# Patient Record
Sex: Male | Born: 1960 | Race: Black or African American | Hispanic: No | Smoking: Former smoker
Health system: Southern US, Community
[De-identification: ages and names within clinical notes are randomized; demographics above are authoritative.]

## PROBLEM LIST (undated history)

## (undated) DIAGNOSIS — I10 Essential (primary) hypertension: Secondary | ICD-10-CM

## (undated) DIAGNOSIS — R569 Unspecified convulsions: Secondary | ICD-10-CM

## (undated) DIAGNOSIS — C801 Malignant (primary) neoplasm, unspecified: Secondary | ICD-10-CM

## (undated) DIAGNOSIS — N189 Chronic kidney disease, unspecified: Secondary | ICD-10-CM

## (undated) DIAGNOSIS — F32A Depression, unspecified: Secondary | ICD-10-CM

## (undated) DIAGNOSIS — F329 Major depressive disorder, single episode, unspecified: Secondary | ICD-10-CM

## (undated) HISTORY — PX: HEMICOLECTOMY W/ OSTOMY: SHX1738

---

## 2005-03-30 ENCOUNTER — Emergency Department: Payer: Self-pay | Admitting: Emergency Medicine

## 2005-10-25 ENCOUNTER — Emergency Department: Payer: Self-pay | Admitting: General Practice

## 2006-06-22 ENCOUNTER — Emergency Department: Payer: Self-pay | Admitting: Emergency Medicine

## 2007-03-16 ENCOUNTER — Emergency Department: Payer: Self-pay | Admitting: Emergency Medicine

## 2007-03-16 ENCOUNTER — Other Ambulatory Visit: Payer: Self-pay

## 2007-03-31 ENCOUNTER — Emergency Department: Payer: Self-pay | Admitting: Emergency Medicine

## 2009-01-13 ENCOUNTER — Emergency Department: Payer: Self-pay | Admitting: Emergency Medicine

## 2009-01-18 ENCOUNTER — Emergency Department: Payer: Self-pay | Admitting: Emergency Medicine

## 2009-03-09 ENCOUNTER — Emergency Department: Payer: Self-pay | Admitting: Emergency Medicine

## 2009-08-31 ENCOUNTER — Emergency Department: Payer: Self-pay | Admitting: Emergency Medicine

## 2010-08-02 ENCOUNTER — Inpatient Hospital Stay: Payer: Self-pay | Admitting: Unknown Physician Specialty

## 2012-06-14 ENCOUNTER — Other Ambulatory Visit: Payer: Self-pay

## 2012-06-30 ENCOUNTER — Encounter (HOSPITAL_COMMUNITY): Payer: Self-pay | Admitting: General Practice

## 2012-06-30 ENCOUNTER — Inpatient Hospital Stay (HOSPITAL_COMMUNITY)
Admission: EM | Admit: 2012-06-30 | Discharge: 2012-07-05 | DRG: 908 | Disposition: A | Discharge status: Home / Self Care | Payer: Non-veteran care | Attending: Internal Medicine | Admitting: Internal Medicine

## 2012-06-30 DIAGNOSIS — E876 Hypokalemia: Secondary | ICD-10-CM | POA: Diagnosis not present

## 2012-06-30 DIAGNOSIS — I10 Essential (primary) hypertension: Secondary | ICD-10-CM

## 2012-06-30 DIAGNOSIS — IMO0002 Reserved for concepts with insufficient information to code with codable children: Secondary | ICD-10-CM

## 2012-06-30 DIAGNOSIS — R7989 Other specified abnormal findings of blood chemistry: Secondary | ICD-10-CM | POA: Diagnosis present

## 2012-06-30 DIAGNOSIS — R509 Fever, unspecified: Secondary | ICD-10-CM | POA: Diagnosis not present

## 2012-06-30 DIAGNOSIS — F10939 Alcohol use, unspecified with withdrawal, unspecified: Secondary | ICD-10-CM | POA: Diagnosis present

## 2012-06-30 DIAGNOSIS — E860 Dehydration: Secondary | ICD-10-CM | POA: Diagnosis present

## 2012-06-30 DIAGNOSIS — R410 Disorientation, unspecified: Secondary | ICD-10-CM | POA: Diagnosis not present

## 2012-06-30 DIAGNOSIS — F10239 Alcohol dependence with withdrawal, unspecified: Secondary | ICD-10-CM | POA: Diagnosis present

## 2012-06-30 DIAGNOSIS — C20 Malignant neoplasm of rectum: Secondary | ICD-10-CM

## 2012-06-30 DIAGNOSIS — Z833 Family history of diabetes mellitus: Secondary | ICD-10-CM

## 2012-06-30 DIAGNOSIS — R000 Tachycardia, unspecified: Secondary | ICD-10-CM | POA: Diagnosis present

## 2012-06-30 DIAGNOSIS — G40909 Epilepsy, unspecified, not intractable, without status epilepticus: Secondary | ICD-10-CM | POA: Diagnosis present

## 2012-06-30 DIAGNOSIS — Z87891 Personal history of nicotine dependence: Secondary | ICD-10-CM

## 2012-06-30 DIAGNOSIS — K9401 Colostomy hemorrhage: Secondary | ICD-10-CM | POA: Diagnosis present

## 2012-06-30 DIAGNOSIS — D649 Anemia, unspecified: Secondary | ICD-10-CM | POA: Diagnosis present

## 2012-06-30 DIAGNOSIS — D696 Thrombocytopenia, unspecified: Secondary | ICD-10-CM

## 2012-06-30 DIAGNOSIS — D62 Acute posthemorrhagic anemia: Secondary | ICD-10-CM | POA: Diagnosis present

## 2012-06-30 HISTORY — DX: Essential (primary) hypertension: I10

## 2012-06-30 HISTORY — DX: Chronic kidney disease, unspecified: N18.9

## 2012-06-30 HISTORY — DX: Major depressive disorder, single episode, unspecified: F32.9

## 2012-06-30 HISTORY — DX: Unspecified convulsions: R56.9

## 2012-06-30 HISTORY — DX: Depression, unspecified: F32.A

## 2012-06-30 HISTORY — DX: Malignant (primary) neoplasm, unspecified: C80.1

## 2012-06-30 LAB — CBC WITH DIFFERENTIAL/PLATELET
Basophils Absolute: 0 10*3/uL (ref 0.0–0.1)
Basophils Relative: 1 % (ref 0–1)
HCT: 26.6 % — ABNORMAL LOW (ref 39.0–52.0)
Hemoglobin: 8.6 g/dL — ABNORMAL LOW (ref 13.0–17.0)
Lymphocytes Relative: 19 % (ref 12–46)
MCHC: 32.3 g/dL (ref 30.0–36.0)
Monocytes Absolute: 0.6 10*3/uL (ref 0.1–1.0)
Neutro Abs: 3 10*3/uL (ref 1.7–7.7)
Neutrophils Relative %: 67 % (ref 43–77)
RDW: 17 % — ABNORMAL HIGH (ref 11.5–15.5)
WBC: 4.5 10*3/uL (ref 4.0–10.5)

## 2012-06-30 LAB — COMPREHENSIVE METABOLIC PANEL
ALT: 70 U/L — ABNORMAL HIGH (ref 0–53)
AST: 216 U/L — ABNORMAL HIGH (ref 0–37)
Albumin: 3.3 g/dL — ABNORMAL LOW (ref 3.5–5.2)
Alkaline Phosphatase: 66 U/L (ref 39–117)
CO2: 23 mEq/L (ref 19–32)
Chloride: 97 mEq/L (ref 96–112)
Creatinine, Ser: 0.52 mg/dL (ref 0.50–1.35)
GFR calc non Af Amer: >90 mL/min (ref >90)
Potassium: 3.4 mEq/L — ABNORMAL LOW (ref 3.5–5.1)
Total Bilirubin: 0.2 mg/dL — ABNORMAL LOW (ref 0.3–1.2)

## 2012-06-30 LAB — PROTIME-INR: INR: 1.06 (ref 0.00–1.49)

## 2012-06-30 MED ORDER — DIPHENHYDRAMINE HCL 12.5 MG/5ML PO ELIX
12.5000 mg | ORAL_SOLUTION | Freq: Four times a day (QID) | ORAL | Status: DC | PRN
  Filled 2012-06-30: qty 10

## 2012-06-30 MED ORDER — ACETAMINOPHEN 650 MG RE SUPP: 650.0000 mg | Freq: Four times a day (QID) | RECTAL | Status: DC | PRN

## 2012-06-30 MED ORDER — OXYCODONE HCL 5 MG PO TABS: 5.0000 mg | ORAL_TABLET | ORAL | Status: DC | PRN

## 2012-06-30 MED ORDER — DIPHENHYDRAMINE HCL 12.5 MG/5ML PO ELIX: 12.5000 mg | ORAL_SOLUTION | Freq: Four times a day (QID) | ORAL | Status: DC | PRN

## 2012-06-30 MED ORDER — ONDANSETRON HCL 4 MG/2ML IJ SOLN: 4.0000 mg | Freq: Four times a day (QID) | INTRAMUSCULAR | Status: DC | PRN

## 2012-06-30 MED ORDER — ACETAMINOPHEN 325 MG PO TABS
650.0000 mg | ORAL_TABLET | Freq: Four times a day (QID) | ORAL | Status: DC | PRN
  Administered 2012-07-01 – 2012-07-05 (×5): 650 mg via ORAL
  Filled 2012-06-30 (×5): qty 2

## 2012-06-30 MED ORDER — DIPHENHYDRAMINE HCL 50 MG/ML IJ SOLN: 12.5000 mg | Freq: Four times a day (QID) | INTRAMUSCULAR | Status: DC | PRN

## 2012-06-30 MED ORDER — OXYCODONE HCL 5 MG PO TABS
5.0000 mg | ORAL_TABLET | ORAL | Status: DC | PRN
  Administered 2012-07-01: 5 mg via ORAL
  Filled 2012-06-30 (×2): qty 1

## 2012-06-30 MED ORDER — SODIUM CHLORIDE 0.9 % IV BOLUS (SEPSIS)
1000.0000 mL | Freq: Once | INTRAVENOUS | Status: AC
  Administered 2012-06-30: 1000 mL via INTRAVENOUS

## 2012-06-30 MED ORDER — ONDANSETRON HCL 4 MG/2ML IJ SOLN
4.0000 mg | Freq: Four times a day (QID) | INTRAMUSCULAR | Status: DC | PRN
  Administered 2012-06-30 – 2012-07-01 (×2): 4 mg via INTRAVENOUS
  Filled 2012-06-30 (×3): qty 2

## 2012-06-30 NOTE — ED Notes (Signed)
Dr. Janee Morn at bedside suturing pt stoma on RLU.

## 2012-06-30 NOTE — ED Notes (Signed)
[facility] transported pt but did not give report to this RN. Vital signs were given to the NT that was in room at time of arrival and were reported to be stable during transport

## 2012-06-30 NOTE — ED Provider Notes (Signed)
History     CSN: [id number]  Arrival date & time 06/30/12  1217   First MD Initiated Contact with Patient 06/30/12 1245      Chief Complaint  Patient presents with  . Stoma Bleed     (Consider location/radiation/quality/duration/timing/severity/associated sxs/prior treatment) HPI Comments: Patient arrives via EMS with active bleeding from his colostomy since 11 AM. Patient states it started spontaneously he denies any trauma. He and unable to control the bleeding at home. Get colostomy done in January 2012 at the [facility] for rectal cancer. He's had no problems until now. He denies any vomiting. Denies any chest pain, dizziness, lightheadedness, fever chills.  The history is provided by the patient and the EMS personnel. The history is limited by the condition of the patient.    No past medical history on file.  No past surgical history on file.  No family history on file.  History  Substance Use Topics  . Smoking status: Not on file  . Smokeless tobacco: Not on file  . Alcohol Use: Not on file      Review of Systems  Constitutional: Positive for fatigue. Negative for fever, activity change and appetite change.  HENT: Negative for congestion and rhinorrhea.   Respiratory: Negative for cough, chest tightness and shortness of breath.   Gastrointestinal: Negative for nausea, vomiting and abdominal pain.  Musculoskeletal: Negative for back pain.  Neurological: Negative for dizziness.    Allergies  Review of patient's allergies indicates no known allergies.  Home Medications  No current outpatient prescriptions on file.  BP 119/76  Pulse 87  Temp 98.4 F (36.9 C) (Oral)  Resp 12  SpO2 96%  Physical Exam  Constitutional: He is oriented to person, place, and time. He appears well-developed and well-nourished. No distress.  HENT:  Head: Normocephalic and atraumatic.  Mouth/Throat: Oropharynx is clear and moist. No oropharyngeal exudate.  Eyes: Conjunctivae and EOM are  normal. Pupils are equal, round, and reactive to light.       Pale conjunctiva  Neck: Normal range of motion. Neck supple.  Cardiovascular: Normal rate, regular rhythm and normal heart sounds.   No murmur heard. Pulmonary/Chest: Effort normal and breath sounds normal. No respiratory distress.  Abdominal: Soft. There is no tenderness. There is no rebound and no guarding.       Right lower quadrant ostomy in place, arterial bleeding from 2:00 position  Musculoskeletal: Normal range of motion. He exhibits no edema and no tenderness.  Neurological: He is alert and oriented to person, place, and time. No cranial nerve deficit.  Skin: Skin is warm.    ED Course  Procedures (including critical care time)  Labs Reviewed  CBC WITH DIFFERENTIAL - Abnormal; Notable for the following:    RBC 3.43 (*)     Hemoglobin 8.6 (*)     HCT 26.6 (*)     MCV 77.6 (*)     MCH 25.1 (*)     RDW 17.0 (*)     Platelets 69 (*)  PLATELET COUNT CONFIRMED BY SMEAR   All other components within normal limits  COMPREHENSIVE METABOLIC PANEL - Abnormal; Notable for the following:    Sodium 134 (*)     Potassium 3.4 (*)     Glucose, Bld 119 (*)     BUN 5 (*)     Calcium 8.1 (*)     Albumin 3.3 (*)     AST 216 (*)     ALT 70 (*)  Total Bilirubin 0.2 (*)     All other components within normal limits  TYPE AND SCREEN  PROTIME-INR  ABO/RH   No results found.   1. Bleeding from colostomy       MDM  Active arterial bleeding from colostomy since 11 AM. Vital signs stable, no distress.  Patient has some drop in his blood pressure from 120s the 60s and became somnolent. With Trendelenburg position, IV hydration, his mental status improved. Bleeding slowed with direct pressure. Surgery consulted emergently.  Trauma blood ordered.  Upon surgeon arrival, bleeding has stopped. Blood pressure has recovered.  Blood transfusion held at this time.  Dr Janee Morn at bedside and placing some suture in ostomy.   Bleeding has stopped.  Plan admission for observation.  CRITICAL CARE Performed by: Glynn Octave   Total critical care time: 30  Critical care time was exclusive of separately billable procedures and treating other patients.  Critical care was necessary to treat or prevent imminent or life-threatening deterioration.  Critical care was time spent personally by me on the following activities: development of treatment plan with patient and/or surrogate as well as nursing, discussions with consultants, evaluation of patient's response to treatment, examination of patient, obtaining history from patient or surrogate, ordering and performing treatments and interventions, ordering and review of laboratory studies, ordering and review of radiographic studies, pulse oximetry and re-evaluation of patient's condition.        Glynn Octave, MD 06/30/12 1600

## 2012-06-30 NOTE — H&P (Signed)
Jesse Hughes is an 51 y.o. male.   Chief Complaint: Bleeding from Ostomy HPI: [name] with bleeding from his ostomy.  His ostomy was performed in [place] of 2012 at the [place] [facility] due to rectal cancer.  His hospital course was complicated by wound healing issues requiring a wound vac.  He was also treated with PO chemo medications.  Since his discharge from the hospital he has had no problems with his ostomy.  At 11am today he went to change his bag and notice excessive bleeding from his ostomy.  He described it as pumping blood.  He tried to control this with towels.  He then came to the hospital because he could not control the bleeding.  He was originally stable in the ER however his pressure began to drop to 50 and the ER was unable to control the bleeding with cauterization or pressure therefore we were called.  No past medical history on file.  No past surgical history on file.  No family history on file. Social History:  does not have a smoking history on file. He does not have any smokeless tobacco history on file. His alcohol and drug histories not on file.  Allergies: Allergies not on file   (Not in a hospital admission)  Results for orders placed during the hospital encounter of 06/30/12 (from the past 48 hour(s))  TYPE AND SCREEN     Status: Normal (Preliminary result)   Collection Time   06/30/12 12:28 PM      Component Value Range Comment   ABO/RH(D) PENDING      Antibody Screen PENDING      Sample Expiration 07/03/2012      Unit Number [id number]      Blood Component Type RED CELLS,LR      Unit division 00      Status of Unit ISSUED      Unit tag comment VERBAL ORDERS PER DR RANCOUR      Transfusion Status OK TO TRANSFUSE      Crossmatch Result PENDING      Unit Number [id number]      Blood Component Type RED CELLS,LR      Unit division 00      Status of Unit ISSUED      Unit tag comment VERBAL ORDERS PER DR Jesse Hughes      Transfusion  Status OK TO TRANSFUSE      Crossmatch Result PENDING      No results found.  Review of Systems  Constitutional: Negative.   HENT: Negative.   Eyes: Negative.   Respiratory: Negative.   Cardiovascular: Negative.   Gastrointestinal: Positive for blood in stool (bleeding from ostomy site today). Negative for nausea, vomiting, abdominal pain and diarrhea.  Genitourinary: Negative.   Musculoskeletal: Negative.   Skin: Negative.   Neurological: Negative.   Endo/Heme/Allergies: Negative.   Psychiatric/Behavioral: Negative.     Blood pressure 124/85, pulse 100, temperature 98.4 F (36.9 C), temperature source Oral, resp. rate 16, SpO2 97.00%. Physical Exam  Constitutional: He is oriented to person, place, and time. He appears well-developed and well-nourished. No distress.  HENT:  Head: Normocephalic and atraumatic.  Eyes: EOM are normal. Pupils are equal, round, and reactive to light.  Neck: Normal range of motion. Neck supple.  Cardiovascular: Normal rate and regular rhythm.   Respiratory: Effort normal and breath sounds normal.  GI: Soft. Bowel sounds are normal. He exhibits no distension. There is no tenderness.  Small arterial vessel bleeding on the mucosa of the ostomy at the 9 o'clock position.  This is treated with a suture by Dr. Janee Morn.  Genitourinary:       Deferred   Musculoskeletal: Normal range of motion.  Neurological: He is alert and oriented to person, place, and time.  Skin: Skin is warm and dry.  Psychiatric: He has a normal mood and affect. His behavior is normal.     Assessment/Plan 1.  Arterial bleeding from ostomy: this was easily controlled with a suture by Dr. Janee Morn however given the amount of blood loss the patient suffered he will be admitted for overnight observation.  We will recheck his CBC in the AM.  The patient will likely go home tomorrow.  Jesse Hughes 06/30/2012, 1:11 PM

## 2012-06-30 NOTE — Op Note (Signed)
Brief operative note Preoperative diagnosis: Bleeding from loop ileostomy stoma Postoperative diagnosis: Bleeding from loop ileostomy stoma Procedure: Suture ligation of bleeding ileostomy loop stoma Surgeon:Silvina Hackleman E History: Patient is status post colectomy for rectal cancer at the [place]. He developed acute onset of bleeding from his ileostomy site today. He came to the emergency department via EMS. On exam I found 2 small arterioles along the left side of the superior loop of his loop ileostomy. Procedure: Emergency consent was obtained and I discussed the plan with the patient.Area was prepped in sterile fashion. Time out procedure was done. I placed multiple figure-of-eight 3-0 Vicryl sutures to control the bleeding. The patient tolerated it well without pain. There is good hemostasis. Vitals stable.We'll plan to admit for overnight observation.

## 2012-06-30 NOTE — H&P (Signed)
Please see my brief operative note as well. Patient examined and I agree with the assessment and plan  Violeta Gelinas, MD, MPH, FACS Pager: [phone]  06/30/2012 1:38 PM

## 2012-06-30 NOTE — Care Management Note (Signed)
  Page 1 of 1   06/30/2012     3:48:51 PM   CARE MANAGEMENT NOTE 06/30/2012  Patient:  Jesse Hughes,Jesse Hughes   Account Number:  [id number]  Date Initiated:  06/30/2012  Documentation initiated by:  Ronny Flurry  Subjective/Objective Assessment:   Bleeding from loop ileostomy stoma  Procedure: Suture ligation of bleeding ileostomy loop stoma     Action/Plan:   Anticipated DC Date:  07/01/2012   Anticipated DC Plan:  HOME/SELF CARE         Choice offered to / List presented to:             Status of service:  In process, will continue to follow Medicare Important Message given?   (If response is "NO", the following Medicare IM given date fields will be blank) Date Medicare IM given:   Date Additional Medicare IM given:    Discharge Disposition:    Per UR Regulation:  Reviewed for med. necessity/level of care/duration of stay  If discussed at Long Length of Stay Meetings, dates discussed:    Comments:

## 2012-07-01 ENCOUNTER — Encounter (HOSPITAL_COMMUNITY): Payer: Self-pay | Admitting: Internal Medicine

## 2012-07-01 ENCOUNTER — Inpatient Hospital Stay (HOSPITAL_COMMUNITY): Payer: Non-veteran care

## 2012-07-01 ENCOUNTER — Encounter (INDEPENDENT_AMBULATORY_CARE_PROVIDER_SITE_OTHER): Payer: Self-pay | Admitting: Internal Medicine

## 2012-07-01 DIAGNOSIS — D696 Thrombocytopenia, unspecified: Secondary | ICD-10-CM | POA: Diagnosis present

## 2012-07-01 DIAGNOSIS — R410 Disorientation, unspecified: Secondary | ICD-10-CM | POA: Diagnosis not present

## 2012-07-01 DIAGNOSIS — C20 Malignant neoplasm of rectum: Secondary | ICD-10-CM | POA: Insufficient documentation

## 2012-07-01 DIAGNOSIS — D649 Anemia, unspecified: Secondary | ICD-10-CM | POA: Diagnosis present

## 2012-07-01 DIAGNOSIS — K9401 Colostomy hemorrhage: Secondary | ICD-10-CM | POA: Diagnosis present

## 2012-07-01 DIAGNOSIS — I10 Essential (primary) hypertension: Secondary | ICD-10-CM | POA: Diagnosis present

## 2012-07-01 DIAGNOSIS — R404 Transient alteration of awareness: Secondary | ICD-10-CM

## 2012-07-01 DIAGNOSIS — G40909 Epilepsy, unspecified, not intractable, without status epilepticus: Secondary | ICD-10-CM | POA: Diagnosis present

## 2012-07-01 DIAGNOSIS — D62 Acute posthemorrhagic anemia: Secondary | ICD-10-CM

## 2012-07-01 DIAGNOSIS — R509 Fever, unspecified: Secondary | ICD-10-CM | POA: Diagnosis not present

## 2012-07-01 DIAGNOSIS — R000 Tachycardia, unspecified: Secondary | ICD-10-CM | POA: Diagnosis present

## 2012-07-01 DIAGNOSIS — R7989 Other specified abnormal findings of blood chemistry: Secondary | ICD-10-CM | POA: Diagnosis present

## 2012-07-01 LAB — CBC
HCT: 25.2 % — ABNORMAL LOW (ref 39.0–52.0)
HCT: 23.5 % — ABNORMAL LOW (ref 39.0–52.0)
Hemoglobin: 8 g/dL — ABNORMAL LOW (ref 13.0–17.0)
Hemoglobin: 7.5 g/dL — ABNORMAL LOW (ref 13.0–17.0)
MCH: 24.5 pg — ABNORMAL LOW (ref 26.0–34.0)
MCH: 25 pg — ABNORMAL LOW (ref 26.0–34.0)
MCHC: 31.7 g/dL (ref 30.0–36.0)
MCHC: 31.9 g/dL (ref 30.0–36.0)
MCV: 77.1 fL — ABNORMAL LOW (ref 78.0–100.0)
MCV: 78.3 fL (ref 78.0–100.0)
Platelets: 70 10*3/uL — ABNORMAL LOW (ref 150–400)
RBC: 3.27 MIL/uL — ABNORMAL LOW (ref 4.22–5.81)
RDW: 16.8 % — ABNORMAL HIGH (ref 11.5–15.5)
WBC: 9.9 10*3/uL (ref 4.0–10.5)

## 2012-07-01 LAB — URINE MICROSCOPIC-ADD ON

## 2012-07-01 LAB — URINALYSIS, ROUTINE W REFLEX MICROSCOPIC
Bilirubin Urine: NEGATIVE
Glucose, UA: NEGATIVE mg/dL
Ketones, ur: >80 mg/dL — AB
Leukocytes, UA: NEGATIVE
Nitrite: NEGATIVE
Protein, ur: NEGATIVE mg/dL
Specific Gravity, Urine: 1.018 (ref 1.005–1.030)
Urobilinogen, UA: 0.2 mg/dL (ref 0.0–1.0)
pH: 5.5 (ref 5.0–8.0)

## 2012-07-01 LAB — BASIC METABOLIC PANEL
BUN: 5 mg/dL — ABNORMAL LOW (ref 6–23)
Chloride: 97 mEq/L (ref 96–112)
Glucose, Bld: 114 mg/dL — ABNORMAL HIGH (ref 70–99)
Potassium: 4.3 mEq/L (ref 3.5–5.1)

## 2012-07-01 MED ORDER — METOPROLOL TARTRATE 1 MG/ML IV SOLN
5.0000 mg | Freq: Once | INTRAVENOUS | Status: AC
  Administered 2012-07-01: 5 mg via INTRAVENOUS
  Filled 2012-07-01: qty 5

## 2012-07-01 MED ORDER — SODIUM CHLORIDE 0.9 % IV SOLN
INTRAVENOUS | Status: DC
  Administered 2012-07-01 – 2012-07-02 (×2): via INTRAVENOUS

## 2012-07-01 MED ORDER — METOPROLOL TARTRATE 1 MG/ML IV SOLN
2.5000 mg | Freq: Four times a day (QID) | INTRAVENOUS | Status: DC | PRN
  Filled 2012-07-01 (×3): qty 5

## 2012-07-01 MED ORDER — LORAZEPAM 2 MG/ML IJ SOLN
1.0000 mg | Freq: Four times a day (QID) | INTRAMUSCULAR | Status: DC | PRN
  Administered 2012-07-03: 1 mg via INTRAVENOUS
  Filled 2012-07-01 (×2): qty 1

## 2012-07-01 MED ORDER — SODIUM CHLORIDE 0.9 % IV SOLN
500.0000 mg | Freq: Two times a day (BID) | INTRAVENOUS | Status: DC
  Administered 2012-07-01 – 2012-07-02 (×2): 500 mg via INTRAVENOUS
  Filled 2012-07-01 (×4): qty 5

## 2012-07-01 MED ORDER — NYSTATIN 100000 UNIT/ML MT SUSP
5.0000 mL | Freq: Four times a day (QID) | OROMUCOSAL | Status: DC
  Administered 2012-07-01 – 2012-07-05 (×11): 500000 [IU] via ORAL
  Filled 2012-07-01 (×19): qty 5

## 2012-07-01 MED ORDER — ONDANSETRON HCL 4 MG/2ML IJ SOLN
4.0000 mg | Freq: Once | INTRAMUSCULAR | Status: AC
  Administered 2012-07-01: 4 mg via INTRAVENOUS

## 2012-07-01 MED ORDER — HYDRALAZINE HCL 20 MG/ML IJ SOLN
10.0000 mg | Freq: Four times a day (QID) | INTRAMUSCULAR | Status: DC | PRN
  Filled 2012-07-01: qty 0.5

## 2012-07-01 MED ORDER — SODIUM CHLORIDE 0.9 % IV SOLN
1000.0000 mg | INTRAVENOUS | Status: AC
  Administered 2012-07-01: 1000 mg via INTRAVENOUS
  Filled 2012-07-01: qty 10
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
## 2012-07-01 NOTE — Progress Notes (Signed)
Responded to code blue, upon arrival patient had pulse and spontaneous respirations. Code blue was called following unresponsiveness after witnessed grand mal seizure. Patient is bleeding profusely from biting tongue. Suctioning large amounts of bright red blood from mouth, O2 applied to patient. Vital signs 179/119 HR 122, O2 100 on 2L, RR 22, Additional PIV obtained, labs just drawn, primary MD notified, and will be up to assess. Will continue to monitor, suction and oxygen remain at bedside

## 2012-07-01 NOTE — Progress Notes (Signed)
MD notified that pt had seizure bp 179/119 O2 100% on 2l hr 117 pt had blood coming from mouth he may have bite his tongue during the seizure code blue called and cancelled. New Orders received prior to seizure pt had c/o of nausea and Zofran 4mg  was administered. Ilean Skill LPN

## 2012-07-01 NOTE — Progress Notes (Signed)
PHARMACY NOTE RELATED TO MEDICATION HISTORY  The [facility] faxed over medication records and they reside in the shadow chart. I reviewed records with patient. He tells me has not taken anything in the over a month. Unsure how accurate below information is because patient was thought to have a seizure this morning and may be post-ictal.  Ferrous gluconate 324mg  po tid - patient stated he has not started yet  Amlodipine 5mg  tab, 2.5 mg po daily - patient stated MD told him to stop  Terazosin 10mg  po qhs - patient stated MD told him to stop  Phenytoin 100 mg cap, 300mg  po q12h - patient stated MD told him to stop; he was taking 300mg  po daily when he stopped taking it   [facility], [address]D., BCPS Clinical Pharmacist Pager: [phone] 07/01/2012 9:07 AM

## 2012-07-01 NOTE — Consult Note (Signed)
WOC ostomy consult  Stoma type/location: Called yesterday afternoon to order supplies for ostomy patient who had experienced bleeding and had sutures applied by CCS team.   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 Stomal assessment/size: Stoma red and viable, above skin level   Appliance was applied yesterday, according to pt. Output Small brown liquid in pouch, no further bloody output. Ostomy pouching: 1pc pouch intact with good seal and not removed.  Education provided: Pt states he has had ostomy "for awhile" and denies any problems or questions regarding pouching routines or supplies. Supplies at bedside for staff and patient use. Will not plan to follow further unless re-consulted.  [address], RN, MSN, [employer]  [phone]

## 2012-07-01 NOTE — Code Documentation (Signed)
CODE BLUE NOTE  Patient Name: Jesse Hughes   MRN: [id number]   Date of Birth/ Sex: March 04, 1961 , male      Admission Date: 06/30/2012  Attending Provider: Bishop Limbo, MD  Primary Diagnosis: <principal problem not specified>    Indication: Pt was in his usual state of health until this AM, when he was noted to be seizing. Code blue was subsequently called. At the time of arrival on scene, no ACLS protocol was underway and the patient had stable vital signs.    Technical Description:  - CPR performance duration:  0 minutes  - Was defibrillation or cardioversion used? No   - Was external pacer placed? No  - Was patient intubated pre/post CPR? No    Medications Administered: Y = Yes; Blank = No Amiodarone    Atropine    Calcium    Epinephrine    Lidocaine    Magnesium    Norepinephrine    Phenylephrine    Sodium bicarbonate    Vasopressin      Post CPR evaluation:  - Final Status - Was patient successfully resuscitated ? Yes - What is current rhythm? Sinus   - What is current hemodynamic status? stable   Miscellaneous Information:  - Labs sent, including: none  - Primary team notified?  Yes  - Family Notified? No  - Additional notes/ transfer status: Pt was having a seizure and bit his tongue. On arrival of code team they were suctioning out his mouth and he had swallowed some blood. Vitals signs were stable throughout the event. Primary team was notified of this event.    Judie Bonus, MD  07/01/2012, 7:02 AM

## 2012-07-01 NOTE — Progress Notes (Signed)
Looks post ictal.  No further bleeding.  Medicine consult.

## 2012-07-01 NOTE — Progress Notes (Signed)
Pt stated that he takes dilantin for his seizure control not sure of dosage and when the last time medicated

## 2012-07-01 NOTE — Consult Note (Signed)
Patient's PCP: [facility] Referring Physician: Dr. Luisa Hart  Reason for the consult: Elevated blood pressure, fever, possible seizure  History of Present Illness: Jesse Hughes is a 51 y.o. African American male with history of hypertension, depression, chronic kidney disease, seizure disorder, rectal cancer status post hemicolectomy with ostomy in February of 2012.  He had taken oral chemotherapy agents for his rectal cancer.  He presented to the emergency department yesterday as he was bleeding from his ostomy.  Initially his blood pressures were low in the ER was unable to control his bleeding with cauterization or pressure as a result general surgery was consulted, he had suture ligation of bleeding ileostomy loop stoma and hemostasis was achieved.  He was admitted to the surgical service for observation.  This morning at approximately 7 a.m. CODE BLUE was called as patient was noted to be seizing.  Patient had also bit his tongue, seizure resolved spontaneously.  Subsequently after a seizure episode he was found to be febrile with temperature of 101.6, tachycardic, and hypertensive.  The hospitalist service was consulted for further care and management.  When primary service evaluated the patient this morning he was found to be confused.  On my interview with the patient, he was able to provide most of the history.  Patient reported that approximately 4 months ago he was taken off of his antihypertensive medications by his doctors at [facility] and 2 months ago he was taken off of his seizure medications.  Prior to admission denies any recent fevers, nausea or vomiting, shortness of breath, chest pain, abdominal pain, diarrhea, headaches or vision changes.  Has slurred speech from biting his tongue.  Past Medical History  Diagnosis Date  . Hypertension   . Depression   . Chronic kidney disease     HX OF ACUTE KIDNEY FAILURE  . Seizures   . Cancer     rectal cancer   Past Surgical History    Procedure Date  . Hemicolectomy w/ ostomy    Family History  Problem Relation Age of Onset  . Heart attack Father   . Diabetes Father    History   Social History  . Marital Status: Divorced    Spouse Name: N/A    Number of Children: N/A  . Years of Education: N/A   Occupational History  . Not on file.   Social History Main Topics  . Smoking status: Former Smoker    Quit date: 12/31/1998  . Smokeless tobacco: Not on file  . Alcohol Use: No     quit  . Drug Use: No     quit in [employer]  . Sexually Active: Not Currently   Other Topics Concern  . Not on file   Social History Narrative  . No narrative on file   Allergies: Diapers & supplies and Other  Meds: Scheduled Meds:   . metoprolol  5 mg Intravenous Once  . ondansetron (ZOFRAN) IV  4 mg Intravenous Once  . sodium chloride  1,000 mL Intravenous Once   Continuous Infusions:   . sodium chloride 125 mL/hr at 07/01/12 0943   PRN Meds:.acetaminophen, acetaminophen, diphenhydrAMINE, diphenhydrAMINE, hydrALAZINE, LORazepam, metoprolol, ondansetron, oxyCODONE, DISCONTD: diphenhydrAMINE, DISCONTD: diphenhydrAMINE, DISCONTD: ondansetron, DISCONTD: oxyCODONE  Review of Systems: All systems reviewed with the patient and positive as per history of present illness, otherwise all other systems are negative.   Physical Exam: Blood pressure 161/96, pulse 110, temperature 101.6 F (38.7 C), temperature source Oral, resp. rate 18, height 5\' 10"  (1.778  m), weight 75.388 kg (166 lb 3.2 oz), SpO2 100.00%. General: Awake, Oriented x2 (patient thought that he was at [facility] and was unable state the year), No acute distress. HEENT: EOMI, Moist mucous membranes, multiple lacerations noted on his tongue bilaterally. Neck: Supple CV: S1 and S2 Lungs: Clear to ascultation bilaterally Abdomen: Soft, Nontender, Nondistended, +bowel sounds. Ext: Good pulses. Trace edema. No clubbing or cyanosis noted. Neuro: Cranial Nerves  II-XII grossly intact. Has 5/5 motor strength in upper and lower extremities.  Lab results:  [facility] 07/01/12 0618 06/30/12 1228  NA 137 134*  K 4.3 3.4*  CL 97 97  CO2 24 23  GLUCOSE 114* 119*  BUN 5* 5*  CREATININE 0.43* 0.52  CALCIUM 8.8 8.1*  MG 1.5 --  PHOS -- --    Basename 06/30/12 1228  AST 216*  ALT 70*  ALKPHOS 66  BILITOT 0.2*  PROT 7.0  ALBUMIN 3.3*   No results found for this basename: LIPASE:2,AMYLASE:2 in the last 72 hours  Basename 07/01/12 0618 06/30/12 1228  WBC 9.9 4.5  NEUTROABS -- 3.0  HGB 8.0* 8.6*  HCT 25.2* 26.6*  MCV 77.1* 77.6*  PLT 70* 69*   No results found for this basename: CKTOTAL:3,CKMB:3,CKMBINDEX:3,TROPONINI:3 in the last 72 hours No components found with this basename: POCBNP:3 No results found for this basename: DDIMER in the last 72 hours No results found for this basename: HGBA1C:2 in the last 72 hours No results found for this basename: CHOL:2,HDL:2,LDLCALC:2,TRIG:2,CHOLHDL:2,LDLDIRECT:2 in the last 72 hours No results found for this basename: TSH,T4TOTAL,FREET3,T3FREE,THYROIDAB in the last 72 hours No results found for this basename: VITAMINB12:2,FOLATE:2,FERRITIN:2,TIBC:2,IRON:2,RETICCTPCT:2 in the last 72 hours Imaging results:  No results found. Other results: EKG: there are no previous tracings available for comparison, sinus tachycardia.  Assessment & Plan by Problem: Acute delirium/altered mental status Likely due to seizure.  Improved, appears to be at baseline.  Seizure disorder Patient reports that he has been taken off his antiepileptic medications approximately 2 months ago by his doctors at the [facility].  Given his seizure episode this morning, neurology has been consult.  MRI of the brain ordered.  Patient has been started on Keppra by neurology.  PRN Ativan for seizure.  Fever Etiology unclear.  Likely related to seizure, hold starting antibiotics.  Will initiate infectious workup.  Send blood cultures x2.   Urinalysis negative for urinary tract infection.  Chest x-ray pending. PRN Tylenol.  Hypertension Patient again reports that he has been taken off of his antihypertensive medications for his doctors at the [facility].  Yesterday blood pressure was fairly under control.  When necessary metoprolol and hydralazine ordered for elevated blood pressure.  Depending on patient's clinical course may need to start him on antihypertensive medications.  Tachycardia Likely due to seizure and fever.  Continue to monitor.  Thrombocytopenia Continue to monitor.  Etiology unclear.  Elevated liver function tests Etiology unclear.  Will send for hepatitis panel.  May need further evaluation as outpatient, uncertain if the patient has underlying liver disease.  Anemia Likely due to acute blood loss anemia from bleeding from colostomy.  Continue to monitor hemoglobin.  Bleeding from colostomy Patient is status post suture ligation of bleeding ileostomy loop stoma on 06/30/2012.  Per general surgery.  History of rectal cancer Further management as outpatient.  Prophylaxis SCDs  Thank you for the consult.  Continue to follow.  Time spent on consult, talking to the patient, and coordinating care was: 60 mins.  Fujiko Picazo A, MD 07/01/2012, 11:04  AM

## 2012-07-01 NOTE — Progress Notes (Signed)
Patient ID: Elise Gladden, male   DOB: 20-Nov-1961, 51 y.o.   MRN: [id number]    Subjective: Pt seems dazed this am.  Nurses report seizure like event this am.  Patient acting post-ictal.  Also complains of nausea but denies abd pain or vomiting.  No further bleeding last night.  Pharmacy has spoken with patient and he seems to have not taken any of his medicine in several months but unsure if this was by his choice or his doctors choice.  It is hard to get information from him at this time.  Objective: Vital signs in last 24 hours: Temp:  [98.4 F (36.9 C)-99.5 F (37.5 C)] 99.2 F (37.3 C) (07/26 0605) Pulse Rate:  [82-126] 122  (07/26 0700) Resp:  [12-22] 16  (07/26 0700) BP: (100-182)/(63-119) 166/95 mmHg (07/26 0700) SpO2:  [95 %-100 %] 100 % (07/26 0700) Weight:  [166 lb 3.2 oz (75.388 kg)] 166 lb 3.2 oz (75.388 kg) (07/25 1823)    Intake/Output from previous day: 07/25 0701 - 07/26 0700 In: -  Out: 650 [Urine:600; Stool:50] Intake/Output this shift:    PE: Heart: RRR Lungs: CTA bilateral Neuro: CN 3-12 grossly intact, affect unusual this AM compared to yesterday (seems post-ictal) Abd: soft, nontender, +bs, ostomy pink with good output, no bleeding present.  Lab Results:   Basename 07/01/12 0618 06/30/12 1228  WBC 9.9 4.5  HGB 8.0* 8.6*  HCT 25.2* 26.6*  PLT 70* 69*   BMET  Basename 07/01/12 0618 06/30/12 1228  NA 137 134*  K 4.3 3.4*  CL 97 97  CO2 24 23  GLUCOSE 114* 119*  BUN 5* 5*  CREATININE 0.43* 0.52  CALCIUM 8.8 8.1*   PT/INR  Basename 06/30/12 1402  LABPROT 14.0  INR 1.06   CMP     Component Value Date/Time   NA 137 07/01/2012 0618   K 4.3 07/01/2012 0618   CL 97 07/01/2012 0618   CO2 24 07/01/2012 0618   GLUCOSE 114* 07/01/2012 0618   BUN 5* 07/01/2012 0618   CREATININE 0.43* 07/01/2012 0618   CALCIUM 8.8 07/01/2012 0618   PROT 7.0 06/30/2012 1228   ALBUMIN 3.3* 06/30/2012 1228   AST 216* 06/30/2012 1228   ALT 70* 06/30/2012 1228   ALKPHOS 66 06/30/2012 1228   BILITOT 0.2* 06/30/2012 1228   GFRNONAA >90 07/01/2012 0618   GFRAA >90 07/01/2012 0618   Lipase  No results found for this basename: lipase       Studies/Results: No results found.  Anti-infectives: Anti-infectives    None       Assessment/Plan 1.  Bleeding from Colostomy: resolved now, Hgb stable this am, dehydrated and acute anemia from bleeding, will start IVFs at 125, no transfusion at this time. 2.  Nausea: this may be associated with the anemia/dehydration or the seizure activity this am. 3.  ?Seizure activity: seems post-ictal this am, has history of seizure d/o but unsure that he has been very compliant with his medicine, he is unable to give [place] very reliable information this am but he has prescription history of dilantin but it seems to have been months since he has taken it.  Will get hospitalists to see patient today to help with management of this.  Likely will not go home today.  Change status to inpatient.  LOS: 1 day    Esker Dever 07/01/2012

## 2012-07-02 DIAGNOSIS — D696 Thrombocytopenia, unspecified: Secondary | ICD-10-CM

## 2012-07-02 DIAGNOSIS — E876 Hypokalemia: Secondary | ICD-10-CM

## 2012-07-02 DIAGNOSIS — G40909 Epilepsy, unspecified, not intractable, without status epilepticus: Secondary | ICD-10-CM

## 2012-07-02 LAB — COMPREHENSIVE METABOLIC PANEL
ALT: 59 U/L — ABNORMAL HIGH (ref 0–53)
AST: 171 U/L — ABNORMAL HIGH (ref 0–37)
Alkaline Phosphatase: 58 U/L (ref 39–117)
CO2: 28 mEq/L (ref 19–32)
GFR calc Af Amer: >90 mL/min (ref >90)
GFR calc non Af Amer: >90 mL/min (ref >90)
Glucose, Bld: 94 mg/dL (ref 70–99)
Potassium: 3 mEq/L — ABNORMAL LOW (ref 3.5–5.1)
Sodium: 135 mEq/L (ref 135–145)
Total Protein: 6.7 g/dL (ref 6.0–8.3)

## 2012-07-02 LAB — CBC
Hemoglobin: 7.6 g/dL — ABNORMAL LOW (ref 13.0–17.0)
Platelets: 68 10*3/uL — ABNORMAL LOW (ref 150–400)
RBC: 2.88 MIL/uL — ABNORMAL LOW (ref 4.22–5.81)

## 2012-07-02 MED ORDER — POTASSIUM CHLORIDE CRYS ER 20 MEQ PO TBCR
40.0000 meq | EXTENDED_RELEASE_TABLET | Freq: Three times a day (TID) | ORAL | Status: AC
  Administered 2012-07-02 (×3): 40 meq via ORAL
  Filled 2012-07-02 (×5): qty 2

## 2012-07-02 MED ORDER — LEVETIRACETAM 500 MG PO TABS
500.0000 mg | ORAL_TABLET | Freq: Two times a day (BID) | ORAL | Status: DC
  Administered 2012-07-02 – 2012-07-03 (×2): 500 mg via ORAL
  Filled 2012-07-02 (×3): qty 1

## 2012-07-02 MED ORDER — POTASSIUM CHLORIDE CRYS ER 20 MEQ PO TBCR
40.0000 meq | EXTENDED_RELEASE_TABLET | Freq: Once | ORAL | Status: DC
  Filled 2012-07-02: qty 2

## 2012-07-02 MED ORDER — FERROUS SULFATE 325 (65 FE) MG PO TABS
325.0000 mg | ORAL_TABLET | Freq: Two times a day (BID) | ORAL | Status: DC
  Administered 2012-07-02 – 2012-07-05 (×5): 325 mg via ORAL
  Filled 2012-07-02 (×9): qty 1

## 2012-07-02 NOTE — Progress Notes (Signed)
Subjective: Alert. Tolerating clear liquid diet. No bleeding from ileostomy.  Insight fair at best. Thinks he had a seizure 5 days ago. No further seizure activity since witnessed tonic-clonic seizure yesterday. MRI brain shows no acute changes. Appreciate internal medicine and neurology consultation advice.  Potassium 3.0. AST 171, ALT 59, other liver function test normal. WBC 7500, hemoglobin 7.6, which is stable   Objective: Vital signs in last 24 hours: Temp:  [99.7 F (37.6 C)-102.5 F (39.2 C)] 101.5 F (38.6 C) (07/27 0620) Pulse Rate:  [91-112] 105  (07/27 0620) Resp:  [18] 18  (07/27 0620) BP: (134-161)/(81-96) 136/92 mmHg (07/27 0620) SpO2:  [98 %-100 %] 98 % (07/27 0620)    Intake/Output from previous day: 07/26 0701 - 07/27 0700 In: 360 [P.O.:360] Out: 1275 [Urine:400; Stool:875] Intake/Output this shift:    General appearance: aalert. Mild confusion. Not really agitated but inattentive to conversation. In no distress. Seemed slightly agitated that I would disturb him during his clear liquid breakfast. GI: abdomen soft.nontender. Nondistended. Midline wound healed. Ileostomy healthy, and no bleeding.  Lab Results:  Results for orders placed during the hospital encounter of 06/30/12 (from the past 24 hour(s))  URINALYSIS, ROUTINE W REFLEX MICROSCOPIC     Status: Abnormal   Collection Time   07/01/12 11:13 AM      Component Value Range   Color, Urine YELLOW  YELLOW   APPearance CLEAR  CLEAR   Specific Gravity, Urine 1.018  1.005 - 1.030   pH 5.5  5.0 - 8.0   Glucose, UA NEGATIVE  NEGATIVE mg/dL   Hgb urine dipstick SMALL (*) NEGATIVE   Bilirubin Urine NEGATIVE  NEGATIVE   Ketones, ur >80 (*) NEGATIVE mg/dL   Protein, ur NEGATIVE  NEGATIVE mg/dL   Urobilinogen, UA 0.2  0.0 - 1.0 mg/dL   Nitrite NEGATIVE  NEGATIVE   Leukocytes, UA NEGATIVE  NEGATIVE  URINE MICROSCOPIC-ADD ON     Status: Abnormal   Collection Time   07/01/12 11:13 AM      Component Value  Range   Squamous Epithelial / LPF RARE  RARE   WBC, UA 0-2  <3 WBC/hpf   RBC / HPF 0-2  <3 RBC/hpf   Bacteria, UA RARE  RARE   Casts HYALINE CASTS (*) NEGATIVE   Urine-Other MUCOUS PRESENT    CBC     Status: Abnormal   Collection Time   07/01/12  3:33 PM      Component Value Range   WBC 9.4  4.0 - 10.5 K/uL   RBC 3.00 (*) 4.22 - 5.81 MIL/uL   Hemoglobin 7.5 (*) 13.0 - 17.0 g/dL   HCT 16.1 (*) 09.6 - 04.5 %   MCV 78.3  78.0 - 100.0 fL   MCH 25.0 (*) 26.0 - 34.0 pg   MCHC 31.9  30.0 - 36.0 g/dL   RDW 40.9 (*) 81.1 - 91.4 %   Platelets 69 (*) 150 - 400 K/uL  CBC     Status: Abnormal   Collection Time   07/02/12  5:18 AM      Component Value Range   WBC 7.5  4.0 - 10.5 K/uL   RBC 2.88 (*) 4.22 - 5.81 MIL/uL   Hemoglobin 7.6 (*) 13.0 - 17.0 g/dL   HCT 78.2 (*) 95.6 - 21.3 %   MCV 78.5  78.0 - 100.0 fL   MCH 26.4  26.0 - 34.0 pg   MCHC 33.6  30.0 - 36.0 g/dL   RDW 08.6 (*)  11.5 - 15.5 %   Platelets 68 (*) 150 - 400 K/uL  COMPREHENSIVE METABOLIC PANEL     Status: Abnormal   Collection Time   07/02/12  5:18 AM      Component Value Range   Sodium 135  135 - 145 mEq/L   Potassium 3.0 (*) 3.5 - 5.1 mEq/L   Chloride 95 (*) 96 - 112 mEq/L   CO2 28  19 - 32 mEq/L   Glucose, Bld 94  70 - 99 mg/dL   BUN 3 (*) 6 - 23 mg/dL   Creatinine, Ser 1.91 (*) 0.50 - 1.35 mg/dL   Calcium 8.3 (*) 8.4 - 10.5 mg/dL   Total Protein 6.7  6.0 - 8.3 g/dL   Albumin 3.2 (*) 3.5 - 5.2 g/dL   AST 478 (*) 0 - 37 U/L   ALT 59 (*) 0 - 53 U/L   Alkaline Phosphatase 58  39 - 117 U/L   Total Bilirubin 0.7  0.3 - 1.2 mg/dL   GFR calc non Af Amer >90  >90 mL/min   GFR calc Af Amer >90  >90 mL/min     Studies/Results: @RISRSLT24 @     . levetiracetam  1,000 mg Intravenous NOW  . levetiracetam  500 mg Intravenous Q12H  . metoprolol  5 mg Intravenous Once  . nystatin  5 mL Oral QID  . ondansetron (ZOFRAN) IV  4 mg Intravenous Once  . potassium chloride  40 mEq Oral Once     Assessment/Plan:  Acute  bleeding from loop ileostomy, resolved following suture ligation of bleeders.  Surgery for rectal cancer in November 2013,   [place], [place], with loop ileostomy. Details of cancer unknown.  Seizure disorder. He had a tonic-clonic seizure yesterday. He has been off his medications. These have been resumed under the guidance of neurology and internal medicine.  Acute blood loss anemia, superimposed on chronic anemia. We'll begin iron supplementation orally  Hypokalemia. Will replace this with K-Dur. Check b-met tomorrow..  Elevated liver function tests.  History hypertension    LOS: 2 days    Syrenity Klepacki M 07/02/2012  . .prob

## 2012-07-02 NOTE — Progress Notes (Addendum)
Subjective: Had fever this morning.  No seizure since yesterday.  Feeling better today.  Objective: Vital signs in last 24 hours: Filed Vitals:   07/01/12 1300 07/01/12 1818 07/02/12 0147 07/02/12 0620  BP: 151/87 144/81 134/83 136/92  Pulse: 112 102 91 105  Temp: 102.5 F (39.2 C) 99.7 F (37.6 C) 99.9 F (37.7 C) 101.5 F (38.6 C)  TempSrc: Oral Oral Oral Oral  Resp: 18 18 18 18   Height:      Weight:      SpO2: 100% 100% 100% 98%   Weight change:   Intake/Output Summary (Last 24 hours) at 07/02/12 1142 Last data filed at 07/02/12 6045  Gross per 24 hour  Intake    360 ml  Output   1075 ml  Net   -715 ml    Physical Exam: General: Awake, Oriented, No acute distress. HEENT: EOMI. Neck: Supple CV: S1 and S2 Lungs: Clear to ascultation bilaterally Abdomen: Soft, Nontender, Nondistended, +bowel sounds. Ext: Good pulses. Trace edema.  Lab Results: Basic Metabolic Panel:  Lab 07/02/12 4098 07/01/12 0618 06/30/12 1228  NA 135 137 134*  K 3.0* 4.3 3.4*  CL 95* 97 97  CO2 28 24 23   GLUCOSE 94 114* 119*  BUN 3* 5* 5*  CREATININE 0.44* 0.43* 0.52  CALCIUM 8.3* 8.8 8.1*  MG -- 1.5 --  PHOS -- -- --   Liver Function Tests:  Lab 07/02/12 0518 06/30/12 1228  AST 171* 216*  ALT 59* 70*  ALKPHOS 58 66  BILITOT 0.7 0.2*  PROT 6.7 7.0  ALBUMIN 3.2* 3.3*   No results found for this basename: LIPASE:5,AMYLASE:5 in the last 168 hours No results found for this basename: AMMONIA:5 in the last 168 hours CBC:  Lab 07/02/12 0518 07/01/12 1533 07/01/12 0618 06/30/12 1228  WBC 7.5 9.4 9.9 4.5  NEUTROABS -- -- -- 3.0  HGB 7.6* 7.5* 8.0* 8.6*  HCT 22.6* 23.5* 25.2* 26.6*  MCV 78.5 78.3 77.1* 77.6*  PLT 68* 69* 70* 69*   Cardiac Enzymes: No results found for this basename: CKTOTAL:5,CKMB:5,CKMBINDEX:5,TROPONINI:5 in the last 168 hours BNP (last 3 results) No results found for this basename: PROBNP:3 in the last 8760 hours CBG: No results found for this basename:  GLUCAP:5 in the last 168 hours No results found for this basename: HGBA1C:5 in the last 72 hours Other Labs: No components found with this basename: POCBNP:3 No results found for this basename: DDIMER:2 in the last 168 hours No results found for this basename: CHOL:2,HDL:2,LDLCALC:2,TRIG:2,CHOLHDL:2,LDLDIRECT:2 in the last 168 hours No results found for this basename: TSH,T4TOTAL,FREET3,T3FREE,FREET4,THYROIDAB in the last 168 hours No results found for this basename: VITAMINB12:2,FOLATE:2,FERRITIN:2,TIBC:2,IRON:2,RETICCTPCT:2 in the last 168 hours  Micro Results: Recent Results (from the past 240 hour(s))  CULTURE, BLOOD (ROUTINE X 2)     Status: Normal (Preliminary result)   Collection Time   07/01/12 10:45 AM      Component Value Range Status Comment   Specimen Description BLOOD ARM RIGHT   Final    Special Requests BOTTLES DRAWN AEROBIC AND ANAEROBIC 10CC   Final    Culture  Setup Time 07/01/2012 23:04   Final    Culture     Final    Value:        BLOOD CULTURE RECEIVED NO GROWTH TO DATE CULTURE WILL BE HELD FOR 5 DAYS BEFORE ISSUING A FINAL NEGATIVE REPORT   Report Status PENDING   Incomplete   CULTURE, BLOOD (ROUTINE X 2)     Status: Normal (  Preliminary result)   Collection Time   07/01/12 10:50 AM      Component Value Range Status Comment   Specimen Description BLOOD HAND RIGHT   Final    Special Requests BOTTLES DRAWN AEROBIC AND ANAEROBIC 10CC   Final    Culture  Setup Time 07/01/2012 23:04   Final    Culture     Final    Value:        BLOOD CULTURE RECEIVED NO GROWTH TO DATE CULTURE WILL BE HELD FOR 5 DAYS BEFORE ISSUING A FINAL NEGATIVE REPORT   Report Status PENDING   Incomplete     Studies/Results: Dg Chest 2 View  07/01/2012  *RADIOLOGY REPORT*  Clinical Data: Fever, weakness  CHEST - 2 VIEW  Comparison: None.  Findings: Cardiomediastinal silhouette is unremarkable.  Mild elevation of the right hemidiaphragm.  No acute infiltrate or pleural effusion.  No pulmonary edema.   IMPRESSION: No active disease.  Mild elevation of the right hemidiaphragm.  Original Report Authenticated By: Natasha Mead, M.D.   Mr Brain Wo Contrast  07/01/2012  *RADIOLOGY REPORT*  Clinical Data: Seizure  MRI HEAD WITHOUT CONTRAST  Technique:  Multiplanar, multiecho pulse sequences of the brain and surrounding structures were obtained according to standard protocol without intravenous contrast.  Comparison: None.  Findings: Mild to moderate cerebral atrophy for age.  Ventricle size is commensurate with atrophy.  Negative for acute or chronic infarct.  Cerebral white matter is normal.  Brainstem and cerebellum show no focal abnormality.  Negative for hemorrhage.  No mass lesion.  Temporal lobes are normal.  Negative for mesial temporal sclerosis.  Pituitary is normal in size.  Negative for hemorrhage or mass.  Chronic sinusitis with mucosal thickening in the paranasal sinuses.  IMPRESSION: Mild to moderate cerebral atrophy.  No infarct or mass.  No acute abnormality.  Original Report Authenticated By: Camelia Phenes, M.D.    Medications: I have reviewed the patient's current medications. Scheduled Meds:   . ferrous sulfate  325 mg Oral BID WC  . levetiracetam  1,000 mg Intravenous NOW  . levetiracetam  500 mg Intravenous Q12H  . nystatin  5 mL Oral QID  . potassium chloride  40 mEq Oral TID  . DISCONTD: potassium chloride  40 mEq Oral Once   Continuous Infusions:   . sodium chloride 125 mL/hr at 07/02/12 0106   PRN Meds:.acetaminophen, acetaminophen, diphenhydrAMINE, diphenhydrAMINE, hydrALAZINE, LORazepam, metoprolol, ondansetron, oxyCODONE  Assessment/Plan: Acute delirium/altered mental status  Likely due to seizure.  Resolved.  Seizure disorder  Continue Keppra, transitioned to oral.  MRI of the brain shows no acute infarct or mass.  When necessary Ativan for seizure.  Fever  Etiology unclear. Likely related to seizure, hold starting antibiotics. Blood cultures x2 show no growth to  date. Urinalysis negative for urinary tract infection. Chest x-ray negative for infectious etiology. PRN Tylenol.   Hypertension  Blood pressure improved, continue when necessary metoprolol and hydralazine.  As blood pressure is improved may not need to be on any hypertensive medications, continue to monitor.  Tachycardia  Likely due to seizure and fever.  Improved.  Thrombocytopenia  Continue to monitor. Etiology unclear.   Elevated liver function tests  Etiology unclear. Hepatitis panel pending. May need further evaluation as outpatient, uncertain liver disease?  Anemia  Likely due to acute blood loss anemia from bleeding from colostomy.  Hemoglobin stable, started on supplemental iron.   Bleeding from colostomy  Patient is status post suture ligation of bleeding ileostomy loop  stoma on 06/30/2012. Per general surgery.   History of rectal cancer  Further management as outpatient.   Hypokalemia Replace as needed.  Prophylaxis  SCDs  Thank you for the consult.  Continue to follow.   LOS: 2 days  Ermal Brzozowski A, MD 07/02/2012, 11:42 AM

## 2012-07-03 ENCOUNTER — Inpatient Hospital Stay (HOSPITAL_COMMUNITY): Payer: Non-veteran care

## 2012-07-03 DIAGNOSIS — D649 Anemia, unspecified: Secondary | ICD-10-CM

## 2012-07-03 LAB — CBC WITH DIFFERENTIAL/PLATELET
Eosinophils Absolute: 0 10*3/uL (ref 0.0–0.7)
Hemoglobin: 7.1 g/dL — ABNORMAL LOW (ref 13.0–17.0)
Lymphocytes Relative: 14 % (ref 12–46)
Lymphs Abs: 1.3 10*3/uL (ref 0.7–4.0)
MCH: 25.3 pg — ABNORMAL LOW (ref 26.0–34.0)
Monocytes Relative: 15 % — ABNORMAL HIGH (ref 3–12)
Neutro Abs: 6.3 10*3/uL (ref 1.7–7.7)
Neutrophils Relative %: 70 % (ref 43–77)
Platelets: 101 10*3/uL — ABNORMAL LOW (ref 150–400)
RBC: 2.81 MIL/uL — ABNORMAL LOW (ref 4.22–5.81)
WBC: 9.1 10*3/uL (ref 4.0–10.5)

## 2012-07-03 LAB — COMPREHENSIVE METABOLIC PANEL
ALT: 102 U/L — ABNORMAL HIGH (ref 0–53)
Alkaline Phosphatase: 61 U/L (ref 39–117)
BUN: 12 mg/dL (ref 6–23)
CO2: 19 mEq/L (ref 19–32)
Chloride: 93 mEq/L — ABNORMAL LOW (ref 96–112)
GFR calc Af Amer: >90 mL/min (ref >90)
GFR calc non Af Amer: >90 mL/min (ref >90)
Glucose, Bld: 106 mg/dL — ABNORMAL HIGH (ref 70–99)
Potassium: 3.9 mEq/L (ref 3.5–5.1)
Sodium: 132 mEq/L — ABNORMAL LOW (ref 135–145)
Total Bilirubin: 0.6 mg/dL (ref 0.3–1.2)

## 2012-07-03 LAB — HEPATITIS PANEL, ACUTE
Hep A IgM: NEGATIVE
Hep B C IgM: NEGATIVE

## 2012-07-03 LAB — RAPID URINE DRUG SCREEN, HOSP PERFORMED
Amphetamines: NOT DETECTED
Barbiturates: NOT DETECTED
Benzodiazepines: NOT DETECTED
Cocaine: NOT DETECTED
Opiates: NOT DETECTED
Tetrahydrocannabinol: NOT DETECTED

## 2012-07-03 LAB — GRAM STAIN

## 2012-07-03 LAB — CSF CELL COUNT WITH DIFFERENTIAL
RBC Count, CSF: 40000 /mm3 — ABNORMAL HIGH
WBC, CSF: 6 /mm3 — ABNORMAL HIGH (ref 0–5)

## 2012-07-03 LAB — URINE CULTURE: Colony Count: 3000

## 2012-07-03 MED ORDER — VITAMIN B-1 100 MG PO TABS
100.0000 mg | ORAL_TABLET | Freq: Every day | ORAL | Status: DC
  Administered 2012-07-04 – 2012-07-05 (×2): 100 mg via ORAL
  Filled 2012-07-03 (×3): qty 1

## 2012-07-03 MED ORDER — LORAZEPAM 2 MG/ML IJ SOLN: 1.0000 mg | Freq: Once | INTRAMUSCULAR | Status: DC | PRN

## 2012-07-03 MED ORDER — LORAZEPAM 1 MG PO TABS
1.0000 mg | ORAL_TABLET | Freq: Four times a day (QID) | ORAL | Status: DC | PRN
  Administered 2012-07-04: 1 mg via ORAL
  Filled 2012-07-03: qty 1

## 2012-07-03 MED ORDER — THIAMINE HCL 100 MG/ML IJ SOLN
100.0000 mg | Freq: Every day | INTRAMUSCULAR | Status: DC
  Administered 2012-07-03: 100 mg via INTRAVENOUS
  Filled 2012-07-03 (×3): qty 1

## 2012-07-03 MED ORDER — MAGNESIUM SULFATE 40 MG/ML IJ SOLN
2.0000 g | Freq: Once | INTRAMUSCULAR | Status: AC
  Administered 2012-07-03: 2 g via INTRAVENOUS
  Filled 2012-07-03: qty 50

## 2012-07-03 MED ORDER — DEXTROSE 5 % IV SOLN
750.0000 mg | Freq: Three times a day (TID) | INTRAVENOUS | Status: DC
  Administered 2012-07-03 – 2012-07-05 (×7): 750 mg via INTRAVENOUS
  Filled 2012-07-03 (×8): qty 15

## 2012-07-03 MED ORDER — VANCOMYCIN HCL IN DEXTROSE 1-5 GM/200ML-% IV SOLN
1000.0000 mg | Freq: Three times a day (TID) | INTRAVENOUS | Status: DC
  Administered 2012-07-03 – 2012-07-05 (×6): 1000 mg via INTRAVENOUS
  Filled 2012-07-03 (×7): qty 200

## 2012-07-03 MED ORDER — ADULT MULTIVITAMIN W/MINERALS CH
1.0000 | ORAL_TABLET | Freq: Every day | ORAL | Status: DC
  Administered 2012-07-04 – 2012-07-05 (×2): 1 via ORAL
  Filled 2012-07-03 (×3): qty 1

## 2012-07-03 MED ORDER — FOLIC ACID 1 MG PO TABS
1.0000 mg | ORAL_TABLET | Freq: Every day | ORAL | Status: DC
  Administered 2012-07-04 – 2012-07-05 (×2): 1 mg via ORAL
  Filled 2012-07-03 (×3): qty 1

## 2012-07-03 MED ORDER — LORAZEPAM 2 MG/ML IJ SOLN
1.0000 mg | INTRAMUSCULAR | Status: DC | PRN
  Administered 2012-07-03: 1 mg via INTRAVENOUS
  Filled 2012-07-03 (×3): qty 1

## 2012-07-03 MED ORDER — HALOPERIDOL LACTATE 5 MG/ML IJ SOLN
INTRAMUSCULAR | Status: AC
  Filled 2012-07-03: qty 1

## 2012-07-03 MED ORDER — LORAZEPAM 2 MG/ML IJ SOLN
1.0000 mg | Freq: Four times a day (QID) | INTRAMUSCULAR | Status: DC | PRN
  Administered 2012-07-03 – 2012-07-04 (×3): 1 mg via INTRAVENOUS
  Filled 2012-07-03 (×2): qty 1

## 2012-07-03 MED ORDER — DEXTROSE 5 % IV SOLN
2.0000 g | Freq: Two times a day (BID) | INTRAVENOUS | Status: DC
  Administered 2012-07-03 – 2012-07-05 (×5): 2 g via INTRAVENOUS
  Filled 2012-07-03 (×7): qty 2

## 2012-07-03 MED ORDER — HALOPERIDOL LACTATE 5 MG/ML IJ SOLN
2.0000 mg | Freq: Once | INTRAMUSCULAR | Status: DC
  Filled 2012-07-03: qty 1

## 2012-07-03 MED ORDER — SODIUM CHLORIDE 0.9 % IV SOLN
500.0000 mg | Freq: Two times a day (BID) | INTRAVENOUS | Status: DC
  Administered 2012-07-03 – 2012-07-04 (×2): 500 mg via INTRAVENOUS
  Filled 2012-07-03 (×3): qty 5

## 2012-07-03 NOTE — Procedures (Signed)
Emergency consent by Dr. Betti Cruz. LP performed at L4-5 with 20G needle Opening and closing pressures not obtained due to patient movement and agitation 8 CCs of blood tinged CSF obtained and sent to lab

## 2012-07-03 NOTE — Progress Notes (Signed)
ANTIBIOTIC CONSULT NOTE - INITIAL  Pharmacy Consult for Vancomycin & Acyclovir Indication: rule out menigitis  Allergies  Allergen Reactions  . Diapers & Supplies Itching  . Other Swelling    Patient states that he is allergic to 3 DEPRESSION MEDICATIONS    Patient Measurements: Height: 5\' 10"  (177.8 cm) Weight: 166 lb 3.2 oz (75.388 kg) IBW/kg (Calculated) : 73   Vital Signs: Temp: 99.4 F (37.4 C) (07/28 1023) Temp src: Oral (07/28 1023) BP: 134/77 mmHg (07/28 1023) Pulse Rate: 104  (07/28 1023) Intake/Output from previous day: 07/27 0701 - 07/28 0700 In: 960 [P.O.:960] Out: 560 [Stool:560] Intake/Output from this shift: Total I/O In: 240 [P.O.:240] Out: -   Labs:  Basename 07/03/12 0559 07/02/12 0518 07/01/12 1533 07/01/12 0618  WBC 9.1 7.5 9.4 --  HGB 7.1* 7.6* 7.5* --  PLT 101* 68* 69* --  LABCREA -- -- -- --  CREATININE 0.66 0.44* -- 0.43*   Estimated Creatinine Clearance: 114.1 ml/min (by C-G formula based on Cr of 0.66).  Microbiology: Recent Results (from the past 720 hour(s))  CULTURE, BLOOD (ROUTINE X 2)     Status: Normal (Preliminary result)   Collection Time   07/01/12 10:45 AM      Component Value Range Status Comment   Specimen Description BLOOD ARM RIGHT   Final    Special Requests BOTTLES DRAWN AEROBIC AND ANAEROBIC 10CC   Final    Culture  Setup Time 07/01/2012 23:04   Final    Culture     Final    Value:        BLOOD CULTURE RECEIVED NO GROWTH TO DATE CULTURE WILL BE HELD FOR 5 DAYS BEFORE ISSUING A FINAL NEGATIVE REPORT   Report Status PENDING   Incomplete   CULTURE, BLOOD (ROUTINE X 2)     Status: Normal (Preliminary result)   Collection Time   07/01/12 10:50 AM      Component Value Range Status Comment   Specimen Description BLOOD HAND RIGHT   Final    Special Requests BOTTLES DRAWN AEROBIC AND ANAEROBIC 10CC   Final    Culture  Setup Time 07/01/2012 23:04   Final    Culture     Final    Value:        BLOOD CULTURE RECEIVED NO GROWTH  TO DATE CULTURE WILL BE HELD FOR 5 DAYS BEFORE ISSUING A FINAL NEGATIVE REPORT   Report Status PENDING   Incomplete   URINE CULTURE     Status: Normal   Collection Time   07/01/12 11:13 AM      Component Value Range Status Comment   Specimen Description URINE, CLEAN CATCH   Final    Special Requests NONE   Final    Culture  Setup Time 07/02/2012 01:00   Final    Colony Count 3,000 COLONIES/ML   Final    Culture INSIGNIFICANT GROWTH   Final    Report Status 07/03/2012 FINAL   Final     Medical History: Past Medical History  Diagnosis Date  . Hypertension   . Depression   . Chronic kidney disease     HX OF ACUTE KIDNEY FAILURE  . Seizures   . Cancer     rectal cancer   Assessment:   Altered mental status and fever today.  Currently gone to IR for LP.  CSF culture to be sent. 7/26 blood cultures negative to date; repeating blood & urine cultures today. To begin Ceftriaxone, Vancomycin & Acyclovir on  return to floor.    Goal of Therapy:  Vancomycin trough level 15-20 mcg/ml appropriate Acyclovir dose for renal and infection  Plan:    Vancomycin 1 gram IV q8hrs.   Acyclovir 750 mg (10 mg/kg) IV q8hrs.   Ceftriaxone 2 gram IV q12hrs.   Will follow-up renal function, culture data & clinical status.     Will plan to check steady-state Vancomycin trough level if positive cultures or significant change in renal function.  Dennie Fetters, [place] Pager: [phone] 07/03/2012,11:22 AM

## 2012-07-03 NOTE — Progress Notes (Signed)
Subjective: No more bleeding from ileostomy. No nausea or vomiting. Denies pain.  He has become more agitated. He is disoriented and confabulating. He has required restraints. He has had fever to 101. Fever is not explained.  [facility] hospitalist remain involved and have ordered lab work and x-rays. CT brain shows no acute intracranial abnormality.  CBC shows WBC 9100 with normal differential, hemoglobin 7.1, essentially stable. Metabolic panel shows sodium 132, potassium 3.9, chloride 93, AST 324, ALT 102. Magnesium 1.2, which is somewhat low. Initial blood cultures negative. Portable chest x-ray unimpressive, minimal atelectasis left lung base.    Objective: Vital signs in last 24 hours: Temp:  [98.3 F (36.8 C)-101.3 F (38.5 C)] 98.3 F (36.8 C) (07/28 0659) Pulse Rate:  [91-151] 114  (07/28 0659) Resp:  [18-28] 26  (07/28 0659) BP: (112-191)/(67-95) 141/95 mmHg (07/28 0659) SpO2:  [97 %-100 %] 98 % (07/28 0659)    Intake/Output from previous day: 07/27 0701 - 07/28 0700 In: 960 [P.O.:960] Out: 560 [Stool:560] Intake/Output this shift:    General appearance: Awake. Alert. Agitated. Restrained. Disoriented to place, situation, time. Apparently intermittently hallucinating. Skin warm and dry. Resp: clear to auscultation bilaterally GI: abdomen is soft, nontender, nondistended. Midline wound healed. His ileostomy pink and healthy. No bleeding. Male genitalia: normal, rectal exam normal. No evidence of perirectal abscess.  Lab Results:  Results for orders placed during the hospital encounter of 06/30/12 (from the past 24 hour(s))  MAGNESIUM     Status: Abnormal   Collection Time   07/03/12  5:00 AM      Component Value Range   Magnesium 1.2 (*) 1.5 - 2.5 mg/dL  CBC WITH DIFFERENTIAL     Status: Abnormal   Collection Time   07/03/12  5:59 AM      Component Value Range   WBC 9.1  4.0 - 10.5 K/uL   RBC 2.81 (*) 4.22 - 5.81 MIL/uL   Hemoglobin 7.1 (*) 13.0 - 17.0 g/dL     HCT 40.9 (*) 81.1 - 52.0 %   MCV 79.7  78.0 - 100.0 fL   MCH 25.3 (*) 26.0 - 34.0 pg   MCHC 31.7  30.0 - 36.0 g/dL   RDW 91.4 (*) 78.2 - 95.6 %   Platelets 101 (*) 150 - 400 K/uL   Neutrophils Relative 70  43 - 77 %   Neutro Abs 6.3  1.7 - 7.7 K/uL   Lymphocytes Relative 14  12 - 46 %   Lymphs Abs 1.3  0.7 - 4.0 K/uL   Monocytes Relative 15 (*) 3 - 12 %   Monocytes Absolute 1.4 (*) 0.1 - 1.0 K/uL   Eosinophils Relative 0  0 - 5 %   Eosinophils Absolute 0.0  0.0 - 0.7 K/uL   Basophils Relative 0  0 - 1 %   Basophils Absolute 0.0  0.0 - 0.1 K/uL  COMPREHENSIVE METABOLIC PANEL     Status: Abnormal   Collection Time   07/03/12  5:59 AM      Component Value Range   Sodium 132 (*) 135 - 145 mEq/L   Potassium 3.9  3.5 - 5.1 mEq/L   Chloride 93 (*) 96 - 112 mEq/L   CO2 19  19 - 32 mEq/L   Glucose, Bld 106 (*) 70 - 99 mg/dL   BUN 12  6 - 23 mg/dL   Creatinine, Ser 2.13  0.50 - 1.35 mg/dL   Calcium 8.6  8.4 - 08.6 mg/dL   Total Protein  7.1  6.0 - 8.3 g/dL   Albumin 3.6  3.5 - 5.2 g/dL   AST 469 (*) 0 - 37 U/L   ALT 102 (*) 0 - 53 U/L   Alkaline Phosphatase 61  39 - 117 U/L   Total Bilirubin 0.6  0.3 - 1.2 mg/dL   GFR calc non Af Amer >90  >90 mL/min   GFR calc Af Amer >90  >90 mL/min     Studies/Results: @RISRSLT24 @     . ferrous sulfate  325 mg Oral BID WC  . haloperidol lactate  2 mg Intravenous Once  . levETIRAcetam  500 mg Oral BID  . nystatin  5 mL Oral QID  . potassium chloride  40 mEq Oral TID  . DISCONTD: levetiracetam  500 mg Intravenous Q12H  . DISCONTD: potassium chloride  40 mEq Oral Once     Assessment/Plan: Acute bleeding from loop ileostomy, resolved for cervical days following suture ligation of bleeders.  History of surgery for rectal cancer, November 2013, Dura-Vent/DA with temporary loop ileostomy. Details unknown. No apparent surgical complication.  Suspect acute alcohol withdrawal syndrome. I think this may account for his seizures, and fever.  There is no evidence of any intra-abdominal or soft tissue infection. I have initiated the acute alcohol withdrawal assessment and treatment protocol with Ativan. Will defer supplementation of magnesium and nutrients to the [facility] hospitalist.  Acute blood loss anemia, superimposed on chronic anemia. Iron supplementation has been started. No signs of any further bleeding  Hypokalemia, improved with potassium supplementation  Elevated liver function tests. Etiology unclear, possibly chronic  History of hypertension.  I discussed his treatment plan with Dr. Betti Cruz a trial hospitalist. We have agreed that he will be transferred to the trial hospital service for management of his acute alcohol withdrawal and medical problems. His surgical pause may be managed as an outpatient, and he should be referred back to the Dura-Vent/DA once his medical condition is stable and he is discharged.  We will be available to assist if further surgical problems arise.     LOS: 3 days    Jesse Hughes 07/03/2012  . .prob

## 2012-07-03 NOTE — Progress Notes (Signed)
Subjective: Patient more confused today.  Not able to provide any history.  Had visual hallucinations, he was attempted to pick something on his chest.  Objective: Vital signs in last 24 hours: Filed Vitals:   07/03/12 0442 07/03/12 0659 07/03/12 0800 07/03/12 1023  BP: 180/86 141/95 141/95 134/77  Pulse: 144 114 114 104  Temp: 101.3 F (38.5 C) 98.3 F (36.8 C)  99.4 F (37.4 C)  TempSrc: Oral Oral  Oral  Resp: 28 26  18   Height:      Weight:      SpO2: 97% 98%  100%   Weight change:   Intake/Output Summary (Last 24 hours) at 07/03/12 1030 Last data filed at 07/03/12 0900  Gross per 24 hour  Intake    720 ml  Output     60 ml  Net    660 ml    Physical Exam: General: Awake, not oriented x3, No acute distress. HEENT: EOMI. Neck: Supple CV: S1 and S2 Lungs: Clear to ascultation bilaterally Abdomen: Soft, Nontender, Nondistended, +bowel sounds. Ext: Good pulses. Trace edema.  Lab Results: Basic Metabolic Panel:  Lab 07/03/12 4782 07/03/12 0500 07/02/12 0518 07/01/12 0618 06/30/12 1228  NA 132* -- 135 137 134*  K 3.9 -- 3.0* 4.3 3.4*  CL 93* -- 95* 97 97  CO2 19 -- 28 24 23   GLUCOSE 106* -- 94 114* 119*  BUN 12 -- 3* 5* 5*  CREATININE 0.66 -- 0.44* 0.43* 0.52  CALCIUM 8.6 -- 8.3* 8.8 8.1*  MG -- 1.2* -- 1.5 --  PHOS -- -- -- -- --   Liver Function Tests:  Lab 07/03/12 0559 07/02/12 0518 06/30/12 1228  AST 324* 171* 216*  ALT 102* 59* 70*  ALKPHOS 61 58 66  BILITOT 0.6 0.7 0.2*  PROT 7.1 6.7 7.0  ALBUMIN 3.6 3.2* 3.3*   No results found for this basename: LIPASE:5,AMYLASE:5 in the last 168 hours No results found for this basename: AMMONIA:5 in the last 168 hours CBC:  Lab 07/03/12 0559 07/02/12 0518 07/01/12 1533 07/01/12 0618 06/30/12 1228  WBC 9.1 7.5 9.4 9.9 4.5  NEUTROABS 6.3 -- -- -- 3.0  HGB 7.1* 7.6* 7.5* 8.0* 8.6*  HCT 22.4* 22.6* 23.5* 25.2* 26.6*  MCV 79.7 78.5 78.3 77.1* 77.6*  PLT 101* 68* 69* 70* 69*   Cardiac Enzymes: No results  found for this basename: CKTOTAL:5,CKMB:5,CKMBINDEX:5,TROPONINI:5 in the last 168 hours BNP (last 3 results) No results found for this basename: PROBNP:3 in the last 8760 hours CBG: No results found for this basename: GLUCAP:5 in the last 168 hours No results found for this basename: HGBA1C:5 in the last 72 hours Other Labs: No components found with this basename: POCBNP:3 No results found for this basename: DDIMER:2 in the last 168 hours No results found for this basename: CHOL:2,HDL:2,LDLCALC:2,TRIG:2,CHOLHDL:2,LDLDIRECT:2 in the last 168 hours No results found for this basename: TSH,T4TOTAL,FREET3,T3FREE,FREET4,THYROIDAB in the last 168 hours No results found for this basename: VITAMINB12:2,FOLATE:2,FERRITIN:2,TIBC:2,IRON:2,RETICCTPCT:2 in the last 168 hours  Micro Results: Recent Results (from the past 240 hour(s))  CULTURE, BLOOD (ROUTINE X 2)     Status: Normal (Preliminary result)   Collection Time   07/01/12 10:45 AM      Component Value Range Status Comment   Specimen Description BLOOD ARM RIGHT   Final    Special Requests BOTTLES DRAWN AEROBIC AND ANAEROBIC 10CC   Final    Culture  Setup Time 07/01/2012 23:04   Final    Culture  Final    Value:        BLOOD CULTURE RECEIVED NO GROWTH TO DATE CULTURE WILL BE HELD FOR 5 DAYS BEFORE ISSUING A FINAL NEGATIVE REPORT   Report Status PENDING   Incomplete   CULTURE, BLOOD (ROUTINE X 2)     Status: Normal (Preliminary result)   Collection Time   07/01/12 10:50 AM      Component Value Range Status Comment   Specimen Description BLOOD HAND RIGHT   Final    Special Requests BOTTLES DRAWN AEROBIC AND ANAEROBIC 10CC   Final    Culture  Setup Time 07/01/2012 23:04   Final    Culture     Final    Value:        BLOOD CULTURE RECEIVED NO GROWTH TO DATE CULTURE WILL BE HELD FOR 5 DAYS BEFORE ISSUING A FINAL NEGATIVE REPORT   Report Status PENDING   Incomplete     Studies/Results: Dg Chest 2 View  07/01/2012  *RADIOLOGY REPORT*   Clinical Data: Fever, weakness  CHEST - 2 VIEW  Comparison: None.  Findings: Cardiomediastinal silhouette is unremarkable.  Mild elevation of the right hemidiaphragm.  No acute infiltrate or pleural effusion.  No pulmonary edema.  IMPRESSION: No active disease.  Mild elevation of the right hemidiaphragm.  Original Report Authenticated By: Natasha Mead, M.D.   Ct Head Wo Contrast  07/03/2012  *RADIOLOGY REPORT*  Clinical Data: 51 year old male with altered mental status.  CT HEAD WITHOUT CONTRAST  Technique:  Contiguous axial images were obtained from the base of the skull through the vertex without contrast.  Comparison: 07/01/2012 MRI  Findings: Generalized cerebral and cerebellar atrophy again identified.  No acute intracranial abnormalities are identified, including mass lesion or mass effect, hydrocephalus, extra-axial fluid collection, midline shift, hemorrhage, or acute infarction.  The visualized bony calvarium is unremarkable.  IMPRESSION: No evidence of acute intracranial abnormality.  Atrophy.  Original Report Authenticated By: Rosendo Gros, M.D.   Mr Brain Wo Contrast  07/01/2012  *RADIOLOGY REPORT*  Clinical Data: Seizure  MRI HEAD WITHOUT CONTRAST  Technique:  Multiplanar, multiecho pulse sequences of the brain and surrounding structures were obtained according to standard protocol without intravenous contrast.  Comparison: None.  Findings: Mild to moderate cerebral atrophy for age.  Ventricle size is commensurate with atrophy.  Negative for acute or chronic infarct.  Cerebral white matter is normal.  Brainstem and cerebellum show no focal abnormality.  Negative for hemorrhage.  No mass lesion.  Temporal lobes are normal.  Negative for mesial temporal sclerosis.  Pituitary is normal in size.  Negative for hemorrhage or mass.  Chronic sinusitis with mucosal thickening in the paranasal sinuses.  IMPRESSION: Mild to moderate cerebral atrophy.  No infarct or mass.  No acute abnormality.  Original Report  Authenticated By: Camelia Phenes, M.D.   Dg Chest Port 1 View  07/03/2012  *RADIOLOGY REPORT*  Clinical Data: Altered mental status, fever.  PORTABLE CHEST - 1 VIEW  Comparison: 07/01/2012  Findings: Minimal left lung base opacity.  Otherwise no interval change.  No pleural effusion or pneumothorax.  Cardiomediastinal contours within the upper normal range.  No acute osseous finding.  IMPRESSION: Minimal left lung base opacity; atelectasis versus early infiltrate  Original Report Authenticated By: Waneta Martins, M.D.    Medications: I have reviewed the patient's current medications. Scheduled Meds:    . cefTRIAXone (ROCEPHIN)  IV  2 g Intravenous Q12H  . ferrous sulfate  325 mg Oral BID WC  .  haloperidol lactate  2 mg Intravenous Once  . levETIRAcetam  500 mg Oral BID  . magnesium sulfate 1 - 4 g bolus IVPB  2 g Intravenous Once  . nystatin  5 mL Oral QID  . potassium chloride  40 mEq Oral TID  . DISCONTD: levetiracetam  500 mg Intravenous Q12H   Continuous Infusions:    . DISCONTD: sodium chloride 125 mL/hr at 07/02/12 0106   PRN Meds:.acetaminophen, acetaminophen, diphenhydrAMINE, diphenhydrAMINE, hydrALAZINE, LORazepam, metoprolol, ondansetron, oxyCODONE, DISCONTD: LORazepam  Assessment/Plan: Acute delirium/altered mental status  Etiology unclear.  May be due to seizure versus possible alcohol withdrawal?  Lumbar puncture done, only 6 CSF, CSF glucose 74.  Traumatic tap.  Not likely to be meningitis given the lumbar puncture.  Attempted to contact family to get consent for the lumbar puncture, no family was available, lumbar puncture was deemed a medical necessity, went ahead and had lumbar puncture.  Patient started empiric vancomycin and ceftriaxone.  Started on acyclovir for possible viral meningitis.  HSV PCR pending.  Seizure disorder  Continue Keppra, transitioned to oral.  MRI of the brain shows no acute infarct or mass.  When necessary Ativan for seizure.  Will get EEG  for further evaluation given altered mental status.  Fever  Etiology unclear.  Uncertain if patient is withdrawing from alcohol.  Started the patient on empiric vancomycin, ceftriaxone, and acyclovir, started on 07/03/2012.  Send blood cultures x2.  Blood culture 1 of 2 positive from 07/01/2012, repeat blood cultures on 07/03/2012 done and pending.  PRN Tylenol.   Alcohol use? Patient on 07/01/2012 denied alcohol use.  Patient's constellation of symptoms with seizure, fever, tachycardia, thrombocytopenia, and elevated liver function tests likely suggests alcohol withdrawal/delirium tremens.  Nursing staff reported that patient had admitted taking alcohol.  Patient started on CIWA protocol.  Continue thiamine and folic acid.  Hypertension  Blood pressure improved, continue when necessary metoprolol and hydralazine.  Continue to monitor.  Tachycardia  Likely due to seizure and fever.  Improved.  Contingent monitor.  Thrombocytopenia  Continue to monitor. Etiology unclear.   Elevated liver function tests  Etiology unclear. Hepatitis panel pending. May need further evaluation as outpatient, uncertain liver disease?  Uncertain if the patient has been drinking alcohol and has not been truthful about his alcohol use.  Anemia  Likely due to acute blood loss anemia from bleeding from colostomy.  Hemoglobin 7.1, transfuse the patient 1 unit one unit of PRBC. Continue supplemental iron.   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 Per general surgery.   History of rectal cancer  Further management as outpatient.   Hypokalemia Replace as needed.  Prophylaxis  SCDs.  Will assume patient's care from surgical service.   LOS: 3 days  Moksh Loomer A, MD 07/03/2012, 10:30 AM

## 2012-07-03 NOTE — Progress Notes (Signed)
Pt. Noted to be agitated and uncooperative. Keeps getting out of bed and going into others patients room. Dr. Luisa Hart informed. Ativan 1 mg PRN ordered. Will continue to monitor.

## 2012-07-03 NOTE — Progress Notes (Signed)
[facility] hospitalist progress note. Chief complaint. Altered mental status. History of present illness. This 51 year old male in hospital with acute delirium and altered mental status thought to be seizure and listed resolved. The patient was noted by the nursing staff to be confused and with some increased agitation. Security was required but the patient did go back quietly to his room. I was called to the bedside and found the patient to be somewhat agitated and really unable to tell me why. The patient did allow vital signs and he was noted to be febrile 101.3 oral. The patient was given a dose of Ativan earlier and this did not make his agitation any better. In fact he may have made it does somewhat worse. Patient has no specific complaints. He was noted per vital signs to be tachycardic and his blood pressure somewhat elevated. I thought perhaps his son no antiseizure medication Keppra may be contributing to confusion but I discussed this with neurology and this was not felt to be the case. Vital signs. Temperature 101.3, pulse 120s respiration 28, blood pressure 180/86. O2 sats 97%. General appearance. Well-developed middle-aged male in no distress. He appears somewhat anxious and agitated. Though he is oriented to time and location he does seem somewhat confused. Cardiac. Regular and tachycardic. Lungs. Clear and equal. Abdomen. Soft with positive bowel sounds. No pain. Neurologic. This is an anxious/tremulous middle-aged male. No unilateral or focal defects. His gait appears stable. Cranial nerves 2-12 grossly intact. Impression/plan. Problem #1. Fever unknown etiology. No clear indication of infection at time of admission. I will obtain blood cultures x2, portable chest x-ray, CBC with differential, compensated metabolic panel, urinalysis with culture and sensitivity. These results show pending at time of dictation will require a followup per rounding staff. I defer any antibiotics at this point  with no site of infection yet identified. Problem #2. Altered mental status. Etiology is unclear. I discussed this with neurology who felt antiseizure medications that were not likely cause. Neurology did recommend CT scan of the head and I have requested this be done on a stat basis.  Problem #3 tachycardia. This thought secondary to fever. EKG demonstrated a rate of 120s. No suggestion of ischemia per current EKG. I expect the pulse rate will come down the posterior Tylenol administration with a decrease in his temperature. Problem #4. Hypertension. Patient already has a when necessary order for hydralazine and Metroprolol that nursing can utilize. Marland Kitchen

## 2012-07-03 NOTE — Progress Notes (Signed)
Pt. With increased agitation. Screaming and wanting to go home. Noted increase in BP and HR. Security called to help keep the patient under control. Dr. Luisa Hart made aware. Requested RN to call and inform medical team of patient condition. Lenny Pastel PA paged. Will continue to monitor

## 2012-07-03 NOTE — Progress Notes (Signed)
CRITICAL VALUE ALERT  Critical value received:  Blood culture gram positive cocci in clusters Date of notification:  07/03/2012  Time of notification:  1228 Critical value read back: yes Nurse who received alert: Roland Rack, RN  MD notified (1st page):  Dr. Betti Cruz     Time of first page:  1229  MD notified (2nd page):  Time of second page:  Responding MD:  Dr. Betti Cruz   Time MD responded:  [phone]

## 2012-07-03 NOTE — Progress Notes (Signed)
Patient seen and examined. Chart reviewed. Agree with Claiborne Billings, NP. Full progress note to follow.  Teriyah Purington A, MD 07/03/2012, 8:32 AM

## 2012-07-04 ENCOUNTER — Inpatient Hospital Stay (HOSPITAL_COMMUNITY): Payer: Non-veteran care

## 2012-07-04 DIAGNOSIS — R569 Unspecified convulsions: Secondary | ICD-10-CM

## 2012-07-04 LAB — TYPE AND SCREEN
Antibody Screen: NEGATIVE
Unit division: 0
Unit division: 0
Unit division: 0

## 2012-07-04 LAB — COMPREHENSIVE METABOLIC PANEL
BUN: 5 mg/dL — ABNORMAL LOW (ref 6–23)
Calcium: 8.3 mg/dL — ABNORMAL LOW (ref 8.4–10.5)
Creatinine, Ser: 0.5 mg/dL (ref 0.50–1.35)
GFR calc Af Amer: >90 mL/min (ref >90)
Glucose, Bld: 92 mg/dL (ref 70–99)
Sodium: 136 mEq/L (ref 135–145)
Total Protein: 6.4 g/dL (ref 6.0–8.3)

## 2012-07-04 LAB — CBC
HCT: 23.4 % — ABNORMAL LOW (ref 39.0–52.0)
Hemoglobin: 7.6 g/dL — ABNORMAL LOW (ref 13.0–17.0)
MCH: 25.4 pg — ABNORMAL LOW (ref 26.0–34.0)
MCHC: 32.5 g/dL (ref 30.0–36.0)

## 2012-07-04 MED ORDER — LEVETIRACETAM 500 MG PO TABS
500.0000 mg | ORAL_TABLET | Freq: Two times a day (BID) | ORAL | Status: DC
  Administered 2012-07-04 – 2012-07-05 (×2): 500 mg via ORAL
  Filled 2012-07-04 (×3): qty 1

## 2012-07-04 MED ORDER — POTASSIUM CHLORIDE CRYS ER 20 MEQ PO TBCR
40.0000 meq | EXTENDED_RELEASE_TABLET | Freq: Once | ORAL | Status: AC
  Administered 2012-07-04: 40 meq via ORAL
  Filled 2012-07-04: qty 2

## 2012-07-04 NOTE — Progress Notes (Signed)
Routine EEG completed.  

## 2012-07-04 NOTE — Progress Notes (Signed)
Subjective: Had fever this morning. Mentation normal, back to baseline. Oriented x3. Feeling better.  Objective: Vital signs in last 24 hours: Filed Vitals:   07/03/12 2132 07/04/12 0215 07/04/12 0609 07/04/12 0859  BP: 136/81 114/64 127/71 128/73  Pulse: 85 92 93 120  Temp: 98.4 F (36.9 C) 99.6 F (37.6 C) 101.3 F (38.5 C)   TempSrc: Axillary Oral Oral   Resp: 18 19 18    Height:      Weight:      SpO2: 100% 99% 100%    Weight change:   Intake/Output Summary (Last 24 hours) at 07/04/12 1154 Last data filed at 07/04/12 0842  Gross per 24 hour  Intake  552.5 ml  Output   1725 ml  Net -1172.5 ml    Physical Exam: General: Awake, not oriented x3, No acute distress. HEENT: EOMI. Neck: Supple CV: S1 and S2 Lungs: Clear to ascultation bilaterally Abdomen: Soft, Nontender, Nondistended, +bowel sounds. Ext: Good pulses. Trace edema.  Lab Results: Basic Metabolic Panel:  Lab 07/04/12 0981 07/03/12 0559 07/03/12 0500 07/02/12 0518 07/01/12 0618 06/30/12 1228  NA 136 132* -- 135 137 134*  K 3.2* 3.9 -- 3.0* 4.3 3.4*  CL 100 93* -- 95* 97 97  CO2 27 19 -- 28 24 23   GLUCOSE 92 106* -- 94 114* 119*  BUN 5* 12 -- 3* 5* 5*  CREATININE 0.50 0.66 -- 0.44* 0.43* 0.52  CALCIUM 8.3* 8.6 -- 8.3* 8.8 8.1*  MG -- -- 1.2* -- 1.5 --  PHOS -- -- -- -- -- --   Liver Function Tests:  Lab 07/04/12 0605 07/03/12 0559 07/02/12 0518 06/30/12 1228  AST 190* 324* 171* 216*  ALT 79* 102* 59* 70*  ALKPHOS 52 61 58 66  BILITOT 0.4 0.6 0.7 0.2*  PROT 6.4 7.1 6.7 7.0  ALBUMIN 2.9* 3.6 3.2* 3.3*   No results found for this basename: LIPASE:5,AMYLASE:5 in the last 168 hours No results found for this basename: AMMONIA:5 in the last 168 hours CBC:  Lab 07/04/12 0605 07/03/12 0559 07/02/12 0518 07/01/12 1533 07/01/12 0618 06/30/12 1228  WBC 5.9 9.1 7.5 9.4 9.9 --  NEUTROABS -- 6.3 -- -- -- 3.0  HGB 7.6* 7.1* 7.6* 7.5* 8.0* --  HCT 23.4* 22.4* 22.6* 23.5* 25.2* --  MCV 78.3 79.7 78.5 78.3  77.1* --  PLT 90* 101* 68* 69* 70* --   Cardiac Enzymes: No results found for this basename: CKTOTAL:5,CKMB:5,CKMBINDEX:5,TROPONINI:5 in the last 168 hours BNP (last 3 results) No results found for this basename: PROBNP:3 in the last 8760 hours CBG: No results found for this basename: GLUCAP:5 in the last 168 hours No results found for this basename: HGBA1C:5 in the last 72 hours Other Labs: No components found with this basename: POCBNP:3 No results found for this basename: DDIMER:2 in the last 168 hours No results found for this basename: CHOL:2,HDL:2,LDLCALC:2,TRIG:2,CHOLHDL:2,LDLDIRECT:2 in the last 168 hours No results found for this basename: TSH,T4TOTAL,FREET3,T3FREE,FREET4,THYROIDAB in the last 168 hours No results found for this basename: VITAMINB12:2,FOLATE:2,FERRITIN:2,TIBC:2,IRON:2,RETICCTPCT:2 in the last 168 hours  Micro Results: Recent Results (from the past 240 hour(s))  CULTURE, BLOOD (ROUTINE X 2)     Status: Normal (Preliminary result)   Collection Time   07/01/12 10:45 AM      Component Value Range Status Comment   Specimen Description BLOOD ARM RIGHT   Final    Special Requests BOTTLES DRAWN AEROBIC AND ANAEROBIC 10CC   Final    Culture  Setup Time 07/01/2012 23:04  Final    Culture     Final    Value:        BLOOD CULTURE RECEIVED NO GROWTH TO DATE CULTURE WILL BE HELD FOR 5 DAYS BEFORE ISSUING A FINAL NEGATIVE REPORT   Report Status PENDING   Incomplete   CULTURE, BLOOD (ROUTINE X 2)     Status: Normal (Preliminary result)   Collection Time   07/01/12 10:50 AM      Component Value Range Status Comment   Specimen Description BLOOD HAND RIGHT   Final    Special Requests BOTTLES DRAWN AEROBIC AND ANAEROBIC 10CC   Final    Culture  Setup Time 07/01/2012 23:04   Final    Culture     Final    Value: STAPHYLOCOCCUS AUREUS     Note: RIFAMPIN AND GENTAMICIN SHOULD NOT BE USED AS SINGLE DRUGS FOR TREATMENT OF STAPH INFECTIONS.     Note: Gram Stain Report Called  to,Read Back By and Verified With: RN C. ROBERTS ON 07/03/12 AT 1220 BY DTERRY   Report Status PENDING   Incomplete   URINE CULTURE     Status: Normal   Collection Time   07/01/12 11:13 AM      Component Value Range Status Comment   Specimen Description URINE, CLEAN CATCH   Final    Special Requests NONE   Final    Culture  Setup Time 07/02/2012 01:00   Final    Colony Count 3,000 COLONIES/ML   Final    Culture INSIGNIFICANT GROWTH   Final    Report Status 07/03/2012 FINAL   Final   CULTURE, BLOOD (ROUTINE X 2)     Status: Normal (Preliminary result)   Collection Time   07/03/12  5:49 AM      Component Value Range Status Comment   Specimen Description BLOOD RIGHT HAND   Final    Special Requests BOTTLES DRAWN AEROBIC ONLY 10CC   Final    Culture  Setup Time 07/03/2012 11:08   Final    Culture     Final    Value:        BLOOD CULTURE RECEIVED NO GROWTH TO DATE CULTURE WILL BE HELD FOR 5 DAYS BEFORE ISSUING A FINAL NEGATIVE REPORT   Report Status PENDING   Incomplete   CULTURE, BLOOD (ROUTINE X 2)     Status: Normal (Preliminary result)   Collection Time   07/03/12  5:59 AM      Component Value Range Status Comment   Specimen Description BLOOD RIGHT ARM   Final    Special Requests BOTTLES DRAWN AEROBIC AND ANAEROBIC 10CC EA   Final    Culture  Setup Time 07/03/2012 11:08   Final    Culture     Final    Value:        BLOOD CULTURE RECEIVED NO GROWTH TO DATE CULTURE WILL BE HELD FOR 5 DAYS BEFORE ISSUING A FINAL NEGATIVE REPORT   Report Status PENDING   Incomplete   GRAM STAIN     Status: Normal   Collection Time   07/03/12 12:01 PM      Component Value Range Status Comment   Specimen Description CSF   Final    Special Requests NONE   Final    Gram Stain     Final    Value: CYTOSPIN SLIDE     WBC PRESENT, PREDOMINANTLY PMN     NO ORGANISMS SEEN   Report Status 07/03/2012 FINAL   Final  CSF CULTURE     Status: Normal (Preliminary result)   Collection Time   07/03/12 12:02 PM       Component Value Range Status Comment   Specimen Description CSF   Final    Special Requests NONE   Final    Gram Stain     Final    Value: WBC PRESENT, PREDOMINANTLY PMN     NO ORGANISMS SEEN     CYTOSPIN Performed at [facility]   Culture NO GROWTH 1 DAY   Final    Report Status PENDING   Incomplete     Studies/Results: Ct Head Wo Contrast  07/03/2012  *RADIOLOGY REPORT*  Clinical Data: 51 year old male with altered mental status.  CT HEAD WITHOUT CONTRAST  Technique:  Contiguous axial images were obtained from the base of the skull through the vertex without contrast.  Comparison: 07/01/2012 MRI  Findings: Generalized cerebral and cerebellar atrophy again identified.  No acute intracranial abnormalities are identified, including mass lesion or mass effect, hydrocephalus, extra-axial fluid collection, midline shift, hemorrhage, or acute infarction.  The visualized bony calvarium is unremarkable.  IMPRESSION: No evidence of acute intracranial abnormality.  Atrophy.  Original Report Authenticated By: Rosendo Gros, M.D.   Dg Chest Port 1 View  07/03/2012  *RADIOLOGY REPORT*  Clinical Data: Altered mental status, fever.  PORTABLE CHEST - 1 VIEW  Comparison: 07/01/2012  Findings: Minimal left lung base opacity.  Otherwise no interval change.  No pleural effusion or pneumothorax.  Cardiomediastinal contours within the upper normal range.  No acute osseous finding.  IMPRESSION: Minimal left lung base opacity; atelectasis versus early infiltrate  Original Report Authenticated By: Waneta Martins, M.D.   Dg Fluoro Guide Lumbar Puncture  07/03/2012  *RADIOLOGY REPORT*  Clinical Data:  51 year old male with altered mental status and fever.  DIAGNOSTIC LUMBAR PUNCTURE UNDER FLUOROSCOPIC GUIDANCE  Fluoroscopy time:  0.2 minutes.  Technique: Emergency informed consent was obtained from Dr. Betti Cruz, the patient's physician, prior to the procedure, including potential complications of headache,  allergy, and pain.   With the patient prone, the lower back was prepped with Betadine.  1% Lidocaine was used for local anesthesia.  Lumbar puncture was performed at the L4-L5 level using a 20 gauge gauge needle with return of slightly bloody CSF.   8 ml of CSF were obtained for laboratory studies. Opening and closing pressures were not obtained secondary to patient movement and agitation.  The patient tolerated the procedure well and there were no apparent complications.  IMPRESSION: Successful fluoroscopic guided lumbar puncture.  Original Report Authenticated By: Rosendo Gros, M.D.    Medications: I have reviewed the patient's current medications. Scheduled Meds:    . acyclovir  750 mg Intravenous Q8H  . cefTRIAXone (ROCEPHIN)  IV  2 g Intravenous Q12H  . ferrous sulfate  325 mg Oral BID WC  . folic acid  1 mg Oral Daily  . haloperidol lactate  2 mg Intravenous Once  . levetiracetam  500 mg Intravenous BID  . multivitamin with minerals  1 tablet Oral Daily  . nystatin  5 mL Oral QID  . thiamine  100 mg Oral Daily   Or  . thiamine  100 mg Intravenous Daily  . vancomycin  1,000 mg Intravenous Q8H  . DISCONTD: levETIRAcetam  500 mg Oral BID   Continuous Infusions:   PRN Meds:.acetaminophen, acetaminophen, diphenhydrAMINE, diphenhydrAMINE, hydrALAZINE, LORazepam, LORazepam, metoprolol, ondansetron, oxyCODONE, DISCONTD: LORazepam, DISCONTD: LORazepam  Assessment/Plan: Acute delirium/altered mental status  Etiology  unclear.  Likely due to seizure disorder.  Low likelihood to be due to alcohol withdrawal, as the patient stated that he has not had any alcohol for the last few months  Lumbar puncture done, only 6 CSF, CSF glucose 74.  Traumatic tap.  Continue empiric vancomycin and ceftriaxone, started on 07/03/2012.  Continue acyclovir for possible viral meningitis, started on 07/03/2012.  HSV PCR pending. VDRL pending.  Seizure disorder  Continue Keppra.  MRI of the brain shows no acute  infarct or mass.  When necessary Ativan for seizure.  EEG done with results pending.  Fever  Etiology unclear, may be due to seizures?  Continue empiric vancomycin, ceftriaxone, and acyclovir, started on 07/03/2012.  Blood culture 1 of 2 positive from 07/01/2012 growing staph aureus, sensitivities pending. Repeat blood cultures x2 on 07/03/2012 done prior to antibiotic administration showed no growth to date.  PRN Tylenol.   History of alcohol use Patient denied any alcohol use at home.  He does report that he has had seizures in the past from alcohol cessation.  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  Continue thiamine and folic acid.  Hypertension  Blood pressure improved, continue when necessary metoprolol and hydralazine.  Continue to monitor.  Tachycardia  Likely due to seizure and fever.  Improved.  Continue to monitor.  Thrombocytopenia  Continue to monitor. Etiology unclear.   Elevated liver function tests  Etiology unclear. Hepatitis panel on 07/02/2012 negative. May need further evaluation as outpatient, uncertain liver disease?    Anemia  Likely due to acute blood loss anemia from bleeding from colostomy.  Hemoglobin 7.6.  Patient transfused one unit of PRBC on 07/03/2012. Continue supplemental iron.   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  History of rectal cancer  Further management as outpatient.   Hypokalemia Replace as needed.  Prophylaxis  SCDs.   LOS: 4 days  Redith Drach A, MD 07/04/2012, 11:54 AM

## 2012-07-05 DIAGNOSIS — IMO0002 Reserved for concepts with insufficient information to code with codable children: Secondary | ICD-10-CM

## 2012-07-05 LAB — CULTURE, BLOOD (ROUTINE X 2)

## 2012-07-05 LAB — VANCOMYCIN, TROUGH: Vancomycin Tr: 13.1 ug/mL (ref 10.0–20.0)

## 2012-07-05 MED ORDER — CEPHALEXIN 500 MG PO CAPS: 1000.0000 mg | ORAL_CAPSULE | Freq: Two times a day (BID) | ORAL | Status: AC

## 2012-07-05 MED ORDER — CEPHALEXIN 500 MG PO CAPS
1000.0000 mg | ORAL_CAPSULE | Freq: Two times a day (BID) | ORAL | Status: DC
  Administered 2012-07-05: 1000 mg via ORAL
  Filled 2012-07-05 (×3): qty 2

## 2012-07-05 MED ORDER — ADULT MULTIVITAMIN W/MINERALS CH: 1.0000 | ORAL_TABLET | Freq: Every day | ORAL | Status: AC

## 2012-07-05 MED ORDER — THIAMINE HCL 100 MG PO TABS: 100.0000 mg | ORAL_TABLET | Freq: Every day | ORAL | Status: AC

## 2012-07-05 MED ORDER — LEVETIRACETAM 500 MG PO TABS: 500.0000 mg | ORAL_TABLET | Freq: Two times a day (BID) | ORAL | Status: AC

## 2012-07-05 MED ORDER — FOLIC ACID 1 MG PO TABS: 1.0000 mg | ORAL_TABLET | Freq: Every day | ORAL | Status: AC

## 2012-07-05 MED ORDER — OXYCODONE HCL 5 MG PO TABS: 2.5000 mg | ORAL_TABLET | Freq: Three times a day (TID) | ORAL | Status: AC | PRN

## 2012-07-05 MED ORDER — METOPROLOL TARTRATE 25 MG PO TABS
25.0000 mg | ORAL_TABLET | Freq: Two times a day (BID) | ORAL | Status: DC
  Administered 2012-07-05: 25 mg via ORAL
  Filled 2012-07-05 (×3): qty 1

## 2012-07-05 MED ORDER — FERROUS SULFATE 325 (65 FE) MG PO TABS: 325.0000 mg | ORAL_TABLET | Freq: Two times a day (BID) | ORAL | Status: AC

## 2012-07-05 MED ORDER — VANCOMYCIN HCL 1000 MG IV SOLR
1250.0000 mg | Freq: Three times a day (TID) | INTRAVENOUS | Status: DC
  Filled 2012-07-05 (×2): qty 1250

## 2012-07-05 MED ORDER — METOPROLOL TARTRATE 25 MG PO TABS: 25.0000 mg | ORAL_TABLET | Freq: Two times a day (BID) | ORAL | Status: AC

## 2012-07-05 NOTE — Progress Notes (Signed)
Subjective: Feeling better, no fevers since yesterday evening. Wondering if he can go home.  Objective: Vital signs in last 24 hours: Filed Vitals:   07/04/12 2158 07/05/12 0145 07/05/12 0627 07/05/12 0958  BP: 134/77 134/80 150/87 127/87  Pulse: 91 92 78 111  Temp: 99.4 F (37.4 C) 99 F (37.2 C) 99.8 F (37.7 C) 98.8 F (37.1 C)  TempSrc: Oral Oral Oral Oral  Resp: 18 18 18 20   Height:      Weight:      SpO2: 96% 97% 98% 100%   Weight change:   Intake/Output Summary (Last 24 hours) at 07/05/12 1224 Last data filed at 07/05/12 0948  Gross per 24 hour  Intake   1280 ml  Output   2850 ml  Net  -1570 ml    Physical Exam: General: Awake, oriented x3, No acute distress. HEENT: EOMI. Neck: Supple CV: S1 and S2 Lungs: Clear to ascultation bilaterally Abdomen: Soft, Nontender, Nondistended, +bowel sounds. Ext: Good pulses. Trace edema.  Lab Results: Basic Metabolic Panel:  Lab 07/04/12 1610 07/03/12 0559 07/03/12 0500 07/02/12 0518 07/01/12 0618 06/30/12 1228  NA 136 132* -- 135 137 134*  K 3.2* 3.9 -- 3.0* 4.3 3.4*  CL 100 93* -- 95* 97 97  CO2 27 19 -- 28 24 23   GLUCOSE 92 106* -- 94 114* 119*  BUN 5* 12 -- 3* 5* 5*  CREATININE 0.50 0.66 -- 0.44* 0.43* 0.52  CALCIUM 8.3* 8.6 -- 8.3* 8.8 8.1*  MG -- -- 1.2* -- 1.5 --  PHOS -- -- -- -- -- --   Liver Function Tests:  Lab 07/04/12 0605 07/03/12 0559 07/02/12 0518 06/30/12 1228  AST 190* 324* 171* 216*  ALT 79* 102* 59* 70*  ALKPHOS 52 61 58 66  BILITOT 0.4 0.6 0.7 0.2*  PROT 6.4 7.1 6.7 7.0  ALBUMIN 2.9* 3.6 3.2* 3.3*   No results found for this basename: LIPASE:5,AMYLASE:5 in the last 168 hours No results found for this basename: AMMONIA:5 in the last 168 hours CBC:  Lab 07/04/12 0605 07/03/12 0559 07/02/12 0518 07/01/12 1533 07/01/12 0618 06/30/12 1228  WBC 5.9 9.1 7.5 9.4 9.9 --  NEUTROABS -- 6.3 -- -- -- 3.0  HGB 7.6* 7.1* 7.6* 7.5* 8.0* --  HCT 23.4* 22.4* 22.6* 23.5* 25.2* --  MCV 78.3 79.7 78.5  78.3 77.1* --  PLT 90* 101* 68* 69* 70* --   Cardiac Enzymes: No results found for this basename: CKTOTAL:5,CKMB:5,CKMBINDEX:5,TROPONINI:5 in the last 168 hours BNP (last 3 results) No results found for this basename: PROBNP:3 in the last 8760 hours CBG: No results found for this basename: GLUCAP:5 in the last 168 hours No results found for this basename: HGBA1C:5 in the last 72 hours Other Labs: No components found with this basename: POCBNP:3 No results found for this basename: DDIMER:2 in the last 168 hours No results found for this basename: CHOL:2,HDL:2,LDLCALC:2,TRIG:2,CHOLHDL:2,LDLDIRECT:2 in the last 168 hours No results found for this basename: TSH,T4TOTAL,FREET3,T3FREE,FREET4,THYROIDAB in the last 168 hours No results found for this basename: VITAMINB12:2,FOLATE:2,FERRITIN:2,TIBC:2,IRON:2,RETICCTPCT:2 in the last 168 hours  Micro Results: Recent Results (from the past 240 hour(s))  CULTURE, BLOOD (ROUTINE X 2)     Status: Normal (Preliminary result)   Collection Time   07/01/12 10:45 AM      Component Value Range Status Comment   Specimen Description BLOOD ARM RIGHT   Final    Special Requests BOTTLES DRAWN AEROBIC AND ANAEROBIC 10CC   Final    Culture  Setup  Time 07/01/2012 23:04   Final    Culture     Final    Value:        BLOOD CULTURE RECEIVED NO GROWTH TO DATE CULTURE WILL BE HELD FOR 5 DAYS BEFORE ISSUING A FINAL NEGATIVE REPORT   Report Status PENDING   Incomplete   CULTURE, BLOOD (ROUTINE X 2)     Status: Normal   Collection Time   07/01/12 10:50 AM      Component Value Range Status Comment   Specimen Description BLOOD HAND RIGHT   Final    Special Requests BOTTLES DRAWN AEROBIC AND ANAEROBIC 10CC   Final    Culture  Setup Time 07/01/2012 23:04   Final    Culture     Final    Value: STAPHYLOCOCCUS AUREUS     Note: RIFAMPIN AND GENTAMICIN SHOULD NOT BE USED AS SINGLE DRUGS FOR TREATMENT OF STAPH INFECTIONS.     Note: Gram Stain Report Called to,Read Back By and  Verified With: RN C. ROBERTS ON 07/03/12 AT 1220 BY DTERRY   Report Status 07/05/2012 FINAL   Final    Organism ID, Bacteria STAPHYLOCOCCUS AUREUS   Final   URINE CULTURE     Status: Normal   Collection Time   07/01/12 11:13 AM      Component Value Range Status Comment   Specimen Description URINE, CLEAN CATCH   Final    Special Requests NONE   Final    Culture  Setup Time 07/02/2012 01:00   Final    Colony Count 3,000 COLONIES/ML   Final    Culture INSIGNIFICANT GROWTH   Final    Report Status 07/03/2012 FINAL   Final   CULTURE, BLOOD (ROUTINE X 2)     Status: Normal (Preliminary result)   Collection Time   07/03/12  5:49 AM      Component Value Range Status Comment   Specimen Description BLOOD RIGHT HAND   Final    Special Requests BOTTLES DRAWN AEROBIC ONLY 10CC   Final    Culture  Setup Time 07/03/2012 11:08   Final    Culture     Final    Value:        BLOOD CULTURE RECEIVED NO GROWTH TO DATE CULTURE WILL BE HELD FOR 5 DAYS BEFORE ISSUING A FINAL NEGATIVE REPORT   Report Status PENDING   Incomplete   CULTURE, BLOOD (ROUTINE X 2)     Status: Normal (Preliminary result)   Collection Time   07/03/12  5:59 AM      Component Value Range Status Comment   Specimen Description BLOOD RIGHT ARM   Final    Special Requests BOTTLES DRAWN AEROBIC AND ANAEROBIC 10CC EA   Final    Culture  Setup Time 07/03/2012 11:08   Final    Culture     Final    Value:        BLOOD CULTURE RECEIVED NO GROWTH TO DATE CULTURE WILL BE HELD FOR 5 DAYS BEFORE ISSUING A FINAL NEGATIVE REPORT   Report Status PENDING   Incomplete   GRAM STAIN     Status: Normal   Collection Time   07/03/12 12:01 PM      Component Value Range Status Comment   Specimen Description CSF   Final    Special Requests NONE   Final    Gram Stain     Final    Value: CYTOSPIN SLIDE     WBC PRESENT, PREDOMINANTLY PMN  NO ORGANISMS SEEN   Report Status 07/03/2012 FINAL   Final   CSF CULTURE     Status: Normal (Preliminary result)    Collection Time   07/03/12 12:02 PM      Component Value Range Status Comment   Specimen Description CSF   Final    Special Requests NONE   Final    Gram Stain     Final    Value: WBC PRESENT, PREDOMINANTLY PMN     NO ORGANISMS SEEN     CYTOSPIN Performed at [facility]   Culture NO GROWTH 2 DAYS   Final    Report Status PENDING   Incomplete     Studies/Results: No results found.  Medications: I have reviewed the patient's current medications. Scheduled Meds:    . cephALEXin  1,000 mg Oral Q12H  . ferrous sulfate  325 mg Oral BID WC  . folic acid  1 mg Oral Daily  . levETIRAcetam  500 mg Oral BID  . multivitamin with minerals  1 tablet Oral Daily  . nystatin  5 mL Oral QID  . potassium chloride  40 mEq Oral Once  . thiamine  100 mg Oral Daily   Or  . thiamine  100 mg Intravenous Daily  . DISCONTD: acyclovir  750 mg Intravenous Q8H  . DISCONTD: cefTRIAXone (ROCEPHIN)  IV  2 g Intravenous Q12H  . DISCONTD: haloperidol lactate  2 mg Intravenous Once  . DISCONTD: levetiracetam  500 mg Intravenous BID  . DISCONTD: vancomycin  1,250 mg Intravenous Q8H  . DISCONTD: vancomycin  1,000 mg Intravenous Q8H   Continuous Infusions:   PRN Meds:.acetaminophen, acetaminophen, diphenhydrAMINE, diphenhydrAMINE, hydrALAZINE, LORazepam, LORazepam, metoprolol, ondansetron, oxyCODONE  Assessment/Plan: Acute delirium/altered mental status  Etiology unclear.  Likely due to seizure disorder.  Low likelihood to be due to alcohol withdrawal, as the patient stated that he has not had any alcohol for the last few months  Lumbar puncture done, only 6 CSF, CSF glucose 74.  Traumatic tap.  Discontinue empiric vancomycin and ceftriaxone, 07/03/2012-07/05/2012.  Discontinue acyclovir for possible viral meningitis, 07/03/2012-07/05/2012.  HSV PCR pending. VDRL pending.  Seizure disorder  Continue Keppra.  MRI of the brain shows no acute infarct or mass.  When necessary Ativan for seizure.  EEG on  07/04/2012 was normal.  Fever  Etiology unclear, may be due to seizures?  Blood culture 1 of 2 positive from 07/01/2012 growing MSSA.  Repeat blood cultures x2 on 07/03/2012 done prior to antibiotic administration showed no growth to date.  Discussed with Dr. Orvan Falconer, ID, given patient had only one of 4 bottles positive, will treat empirically with cephalexin for a total of 14 days.  Patient was given specific instructions that if he is to have any further fevers at home he is to seek urgent care he either at the emergency department or at the [place].    History of alcohol use Patient denied any alcohol use at home.  He does report that he has had seizures in the past from alcohol cessation.  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  Continue thiamine and folic acid.  Hypertension  Blood pressure improved, continue when necessary metoprolol and hydralazine.  Discharge the patient on low dose metoprolol, further titration of antihypertensive medications as outpatient.  Tachycardia  Likely due to seizure and fever.  Improved.  Continue to monitor.  Thrombocytopenia  Continue to monitor. Etiology unclear.   Elevated liver function tests  Etiology unclear. Hepatitis panel on 07/02/2012 negative. May need further evaluation as outpatient, uncertain liver disease?    Anemia  Likely due to acute blood loss anemia from bleeding from colostomy.  Hemoglobin 7.6.  Patient transfused one unit of PRBC on 07/03/2012. Continue supplemental iron.   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  History of rectal cancer  Further management as outpatient.   Hypokalemia Replace as needed.  Prophylaxis  SCDs.  Disposition Discharge the patient today.   LOS: 5 days  Jesse Hughes A, MD 07/05/2012, 12:24 PM

## 2012-07-05 NOTE — Discharge Summary (Signed)
Physician Discharge Summary  Jesse Hughes ZOX:[id number] DOB: September 05, 1961 DOA: 06/30/2012  PCP: No primary provider on file.  Admit date: 06/30/2012 Discharge date: 07/05/2012  Recommendations for Outpatient Follow-up:  1. Followup with your primary care physician at [facility] for management of her blood pressure, seizure disorder, and elevated liver function tests. 2. Patient was instructed if he has any fever he is to seek care either at our emergency department or at [facility]. 3. HSV PCR pending. VDRL pending.  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 male who initially presented to ED for ostomy bleeding."  Hospital Course:  Acute delirium/altered mental status  Likely due to seizure disorder, initially patient was admitted to the general surgery service for ostomy bleeding, as a surgical issues resolve he was transferred to the hospitalist service for further care and management.  Low likelihood that acute delirium is to be due to alcohol withdrawal, as the patient stated that he has not had any alcohol for the last few months  Lumbar puncture done, only 6 CSF, CSF glucose 74.  Traumatic tap.  Initially started on vancomycin and ceftriaxone which has been discontinued, 07/03/2012-07/05/2012.  Initially started on acyclovir which has been discontinued for possible viral meningitis, 07/03/2012-07/05/2012.  HSV PCR pending. VDRL pending.  Seizure disorder  Continue Keppra.  MRI of the brain shows no acute infarct or mass.  When necessary Ativan for seizure.  EEG on 07/04/2012 was  normal.  Fever  Etiology unclear, may be due to seizures?  Blood culture 1 of 2 positive from 07/01/2012 growing MSSA.  Repeat blood cultures x2 on 07/03/2012 done prior to antibiotic administration showed no growth to date.  Discussed with Dr. Orvan Falconer, ID, given patient had only one of 4 bottles positive, will treat empirically with cephalexin for a total of 14 days.  Patient was given specific instructions that if he is to have any further fevers at home he is to seek urgent care he either at the emergency department or at the [place].  Patient expressed understanding to the above instructions.  History of alcohol use Patient denied any alcohol use at home.  He does report that he has had seizures in the past from alcohol cessation.  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  Initially started on CIWA protocol while the patient was confused.  Continue thiamine and folic acid.  Hypertension  Blood pressure improved, continue when necessary metoprolol and hydralazine.  Discharge the patient on metoprolol, further titration of antihypertensive medications as outpatient.  Tachycardia  Likely due to anemia, seizure and fever.  Improved.  Continue to monitor.  Thrombocytopenia  Continue to monitor. Etiology unclear.   Elevated liver function tests  Etiology unclear. Hepatitis panel on 07/02/2012 negative. May need further evaluation as outpatient, uncertain if the patient has underlying liver disease from alcohol use.  Patient was instructed to follow up with his primary care physician to determine if further workup needs to be done for elevated liver function tests.  Patient expressed understanding.  Anemia  Likely due to acute blood loss anemia from bleeding from colostomy.  Hemoglobin 7.6.  Patient transfused one unit of PRBC on 07/03/2012. Continue supplemental iron.   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  History of rectal cancer  Further management as outpatient.   Hypokalemia Replace as needed.  Procedures:  As above.  Consultations:  General surgery  Neurology, Dr. Roseanne Reno  Discharge Exam: Filed Vitals:   07/05/12 0958  BP: 127/87  Pulse: 111  Temp: 98.8 F (37.1 C)  Resp: 20   Filed Vitals:   07/04/12 2158 07/05/12 0145 07/05/12 0627 07/05/12 0958  BP: 134/77 134/80 150/87 127/87  Pulse: 91 92 78 111  Temp: 99.4 F (37.4 C) 99 F (37.2 C) 99.8 F (37.7 C) 98.8 F (37.1 C)  TempSrc: Oral Oral Oral Oral  Resp: 18 18 18 20   Height:      Weight:      SpO2: 96% 97% 98% 100%    Discharge Instructions  Discharge Orders    Future Orders Please Complete By Expires   Diet - low sodium heart healthy      Increase activity slowly      Discharge instructions      Comments:   Followup with PCP [facility]) in 1 week for management of your blood pressure, seizures and elevated liver function test. Please have CBC checked at the next clinic visit. If you have any further fever please seek medical care at ED or at [facility].     Medication List  As of 07/05/2012 12:38 PM   TAKE these medications         cephALEXin 500 MG capsule   Commonly known as: KEFLEX   Take 2 capsules (1,000 mg total) by mouth 2 (two) times daily. For 12 more days.      ferrous sulfate 325 (65 FE) MG tablet   Take 1 tablet (325 mg total) by mouth 2 (two) times daily with a meal.      folic acid 1 MG tablet   Commonly known as: FOLVITE   Take 1 tablet (1 mg total) by mouth daily.      levETIRAcetam 500 MG tablet   Commonly known as: KEPPRA   Take 1 tablet (500 mg total) by mouth 2 (two) times daily.      metoprolol tartrate 25 MG tablet   Commonly known as: LOPRESSOR   Take 1 tablet (25 mg total) by mouth 2 (two) times daily.      multivitamin with minerals Tabs   Take 1 tablet by mouth daily.      oxyCODONE 5 MG immediate release tablet   Commonly known as: Oxy  IR/ROXICODONE   Take 0.5-1 tablets (2.5-5 mg total) by mouth every 8 (eight) hours as needed for pain.      thiamine 100 MG tablet   Take 1 tablet (100 mg total) by mouth daily.              The results of significant diagnostics from this hospitalization (including imaging, microbiology, ancillary and laboratory) are listed below for reference.    Significant Diagnostic Studies: Dg Chest 2 View  07/01/2012  *RADIOLOGY REPORT*  Clinical Data: Fever, weakness  CHEST - 2 VIEW  Comparison: None.  Findings: Cardiomediastinal silhouette is unremarkable.  Mild elevation of the right hemidiaphragm.  No acute infiltrate or pleural effusion.  No pulmonary edema.  IMPRESSION: No active disease.  Mild elevation of the right hemidiaphragm.  Original Report Authenticated By: Lang Snow POP,  M.D.   Ct Head Wo Contrast  07/03/2012  *RADIOLOGY REPORT*  Clinical Data: 51 year old male with altered mental status.  CT HEAD WITHOUT CONTRAST  Technique:  Contiguous axial images were obtained from the base of the skull through the vertex without contrast.  Comparison: 07/01/2012 MRI  Findings: Generalized cerebral and cerebellar atrophy again identified.  No acute intracranial abnormalities are identified, including mass lesion or mass effect, hydrocephalus, extra-axial fluid collection, midline shift, hemorrhage, or acute infarction.  The visualized bony calvarium is unremarkable.  IMPRESSION: No evidence of acute intracranial abnormality.  Atrophy.  Original Report Authenticated By: Rosendo Gros, M.D.   Mr Brain Wo Contrast  07/01/2012  *RADIOLOGY REPORT*  Clinical Data: Seizure  MRI HEAD WITHOUT CONTRAST  Technique:  Multiplanar, multiecho pulse sequences of the brain and surrounding structures were obtained according to standard protocol without intravenous contrast.  Comparison: None.  Findings: Mild to moderate cerebral atrophy for age.  Ventricle size is commensurate with atrophy.  Negative for acute or chronic  infarct.  Cerebral white matter is normal.  Brainstem and cerebellum show no focal abnormality.  Negative for hemorrhage.  No mass lesion.  Temporal lobes are normal.  Negative for mesial temporal sclerosis.  Pituitary is normal in size.  Negative for hemorrhage or mass.  Chronic sinusitis with mucosal thickening in the paranasal sinuses.  IMPRESSION: Mild to moderate cerebral atrophy.  No infarct or mass.  No acute abnormality.  Original Report Authenticated By: Camelia Phenes, M.D.   Dg Chest Port 1 View  07/03/2012  *RADIOLOGY REPORT*  Clinical Data: Altered mental status, fever.  PORTABLE CHEST - 1 VIEW  Comparison: 07/01/2012  Findings: Minimal left lung base opacity.  Otherwise no interval change.  No pleural effusion or pneumothorax.  Cardiomediastinal contours within the upper normal range.  No acute osseous finding.  IMPRESSION: Minimal left lung base opacity; atelectasis versus early infiltrate  Original Report Authenticated By: Waneta Martins, M.D.   Dg Fluoro Guide Lumbar Puncture  07/03/2012  *RADIOLOGY REPORT*  Clinical Data:  51 year old male with altered mental status and fever.  DIAGNOSTIC LUMBAR PUNCTURE UNDER FLUOROSCOPIC GUIDANCE  Fluoroscopy time:  0.2 minutes.  Technique: Emergency informed consent was obtained from Dr. Betti Cruz, the patient's physician, prior to the procedure, including potential complications of headache, allergy, and pain.   With the patient prone, the lower back was prepped with Betadine.  1% Lidocaine was used for local anesthesia.  Lumbar puncture was performed at the L4-L5 level using a 20 gauge gauge needle with return of slightly bloody CSF.   8 ml of CSF were obtained for laboratory studies. Opening and closing pressures were not obtained secondary to patient movement and agitation.  The patient tolerated the procedure well and there were no apparent complications.  IMPRESSION: Successful fluoroscopic guided lumbar puncture.  Original Report Authenticated By:  Rosendo Gros, M.D.    Microbiology: Recent Results (from the past 240 hour(s))  CULTURE, BLOOD (ROUTINE X 2)     Status: Normal (Preliminary result)   Collection Time   07/01/12 10:45 AM      Component Value Range Status Comment   Specimen Description BLOOD ARM RIGHT   Final    Special Requests BOTTLES DRAWN AEROBIC AND ANAEROBIC 10CC   Final    Culture  Setup Time 07/01/2012 23:04   Final    Culture     Final    Value:        BLOOD CULTURE RECEIVED NO GROWTH TO DATE  CULTURE WILL BE HELD FOR 5 DAYS BEFORE ISSUING A FINAL NEGATIVE REPORT   Report Status PENDING   Incomplete   CULTURE, BLOOD (ROUTINE X 2)     Status: Normal   Collection Time   07/01/12 10:50 AM      Component Value Range Status Comment   Specimen Description BLOOD HAND RIGHT   Final    Special Requests BOTTLES DRAWN AEROBIC AND ANAEROBIC 10CC   Final    Culture  Setup Time 07/01/2012 23:04   Final    Culture     Final    Value: STAPHYLOCOCCUS AUREUS     Note: RIFAMPIN AND GENTAMICIN SHOULD NOT BE USED AS SINGLE DRUGS FOR TREATMENT OF STAPH INFECTIONS.     Note: Gram Stain Report Called to,Read Back By and Verified With: RN C. ROBERTS ON 07/03/12 AT 1220 BY DTERRY   Report Status 07/05/2012 FINAL   Final    Organism ID, Bacteria STAPHYLOCOCCUS AUREUS   Final   URINE CULTURE     Status: Normal   Collection Time   07/01/12 11:13 AM      Component Value Range Status Comment   Specimen Description URINE, CLEAN CATCH   Final    Special Requests NONE   Final    Culture  Setup Time 07/02/2012 01:00   Final    Colony Count 3,000 COLONIES/ML   Final    Culture INSIGNIFICANT GROWTH   Final    Report Status 07/03/2012 FINAL   Final   CULTURE, BLOOD (ROUTINE X 2)     Status: Normal (Preliminary result)   Collection Time   07/03/12  5:49 AM      Component Value Range Status Comment   Specimen Description BLOOD RIGHT HAND   Final    Special Requests BOTTLES DRAWN AEROBIC ONLY 10CC   Final    Culture  Setup Time 07/03/2012  11:08   Final    Culture     Final    Value:        BLOOD CULTURE RECEIVED NO GROWTH TO DATE CULTURE WILL BE HELD FOR 5 DAYS BEFORE ISSUING A FINAL NEGATIVE REPORT   Report Status PENDING   Incomplete   CULTURE, BLOOD (ROUTINE X 2)     Status: Normal (Preliminary result)   Collection Time   07/03/12  5:59 AM      Component Value Range Status Comment   Specimen Description BLOOD RIGHT ARM   Final    Special Requests BOTTLES DRAWN AEROBIC AND ANAEROBIC 10CC EA   Final    Culture  Setup Time 07/03/2012 11:08   Final    Culture     Final    Value:        BLOOD CULTURE RECEIVED NO GROWTH TO DATE CULTURE WILL BE HELD FOR 5 DAYS BEFORE ISSUING A FINAL NEGATIVE REPORT   Report Status PENDING   Incomplete   GRAM STAIN     Status: Normal   Collection Time   07/03/12 12:01 PM      Component Value Range Status Comment   Specimen Description CSF   Final    Special Requests NONE   Final    Gram Stain     Final    Value: CYTOSPIN SLIDE     WBC PRESENT, PREDOMINANTLY PMN     NO ORGANISMS SEEN   Report Status 07/03/2012 FINAL   Final   CSF CULTURE     Status: Normal (Preliminary result)   Collection Time  07/03/12 12:02 PM      Component Value Range Status Comment   Specimen Description CSF   Final    Special Requests NONE   Final    Gram Stain     Final    Value: WBC PRESENT, PREDOMINANTLY PMN     NO ORGANISMS SEEN     CYTOSPIN Performed at [facility]   Culture NO GROWTH 2 DAYS   Final    Report Status PENDING   Incomplete      Labs: Basic Metabolic Panel:  Lab 07/04/12 1610 07/03/12 0559 07/03/12 0500 07/02/12 0518 07/01/12 0618 06/30/12 1228  NA 136 132* -- 135 137 134*  K 3.2* 3.9 -- 3.0* 4.3 3.4*  CL 100 93* -- 95* 97 97  CO2 27 19 -- 28 24 23   GLUCOSE 92 106* -- 94 114* 119*  BUN 5* 12 -- 3* 5* 5*  CREATININE 0.50 0.66 -- 0.44* 0.43* 0.52  CALCIUM 8.3* 8.6 -- 8.3* 8.8 8.1*  MG -- -- 1.2* -- 1.5 --  PHOS -- -- -- -- -- --   Liver Function Tests:  Lab 07/04/12 0605  07/03/12 0559 07/02/12 0518 06/30/12 1228  AST 190* 324* 171* 216*  ALT 79* 102* 59* 70*  ALKPHOS 52 61 58 66  BILITOT 0.4 0.6 0.7 0.2*  PROT 6.4 7.1 6.7 7.0  ALBUMIN 2.9* 3.6 3.2* 3.3*   No results found for this basename: LIPASE:5,AMYLASE:5 in the last 168 hours No results found for this basename: AMMONIA:5 in the last 168 hours CBC:  Lab 07/04/12 0605 07/03/12 0559 07/02/12 0518 07/01/12 1533 07/01/12 0618 06/30/12 1228  WBC 5.9 9.1 7.5 9.4 9.9 --  NEUTROABS -- 6.3 -- -- -- 3.0  HGB 7.6* 7.1* 7.6* 7.5* 8.0* --  HCT 23.4* 22.4* 22.6* 23.5* 25.2* --  MCV 78.3 79.7 78.5 78.3 77.1* --  PLT 90* 101* 68* 69* 70* --   Cardiac Enzymes: No results found for this basename: CKTOTAL:5,CKMB:5,CKMBINDEX:5,TROPONINI:5 in the last 168 hours BNP: BNP (last 3 results) No results found for this basename: PROBNP:3 in the last 8760 hours CBG: No results found for this basename: GLUCAP:5 in the last 168 hours  Time coordinating discharge: 40 minutes  Signed:  Trei Schoch A  [facility] 07/05/2012, 12:38 PM

## 2012-07-05 NOTE — Progress Notes (Signed)
ANTIBIOTIC CONSULT NOTE - INITIAL  Pharmacy Consult for Vancomycin Indication: rule out menigitis  Allergies  Allergen Reactions  . Diapers & Supplies Itching  . Other Swelling    Patient states that he is allergic to 3 DEPRESSION MEDICATIONS    Patient Measurements: Height: 5\' 10"  (177.8 cm) Weight: 166 lb 3.2 oz (75.388 kg) IBW/kg (Calculated) : 73   Vital Signs: Temp: 99.8 F (37.7 C) (07/30 0627) Temp src: Oral (07/30 0627) BP: 150/87 mmHg (07/30 0627) Pulse Rate: 78  (07/30 0627)  Labs:  Basename 07/04/12 0605 07/03/12 0559  WBC 5.9 9.1  HGB 7.6* 7.1*  PLT 90* 101*  LABCREA -- --  CREATININE 0.50 0.66   Estimated Creatinine Clearance: 114.1 ml/min (by C-G formula based on Cr of 0.5).  Assessment:  51 yo male with possible meningitis for empiric antibiotics  Goal of Therapy:  Vancomycin trough 15-20  Plan:  Increase Vancomycin 1250 mg IV q8h  Nattaly Yebra Vernon,07/05/2012,7:27 AM

## 2012-07-05 NOTE — Procedures (Signed)
EEG# [id number]  This routine EEG was requested in this 51 year old man who has a history of seizures, but who was recently weaned off anti-seizure medication due to being seizure free.  He has had a generalized tonic clonic seizure after surgery.  Medications include Keppra.  The EEG was done with the patient awake.  During periods of maximal wakefulness he had a low amplitude 9-10 cps alpha rhythm that was symmetric.  Background activities were composed of very low amplitude beta activities.  Photic stimulation did not produce a driving response.  The patient was not hyperventilated.  The patient did not sleep.  Clinical Interpretation:  This routine EEG done with the patient awake is normal.  Jesse Hughes. Modesto Charon, MD [facility], [place]

## 2012-07-06 LAB — HSV(HERPES SMPLX VRS)ABS-I+II(IGG)-CSF

## 2012-07-07 LAB — CULTURE, BLOOD (ROUTINE X 2)

## 2012-07-07 LAB — CSF CULTURE W GRAM STAIN: Culture: NO GROWTH

## 2012-07-09 LAB — CULTURE, BLOOD (ROUTINE X 2)
Culture: NO GROWTH
Culture: NO GROWTH

## 2012-08-04 ENCOUNTER — Inpatient Hospital Stay: Payer: Self-pay | Admitting: Surgery

## 2012-08-04 LAB — CBC WITH DIFFERENTIAL/PLATELET
Basophil #: 0.1 10*3/uL (ref 0.0–0.1)
Eosinophil %: 0 %
HCT: 19.1 % — ABNORMAL LOW (ref 40.0–52.0)
HGB: 6.1 g/dL — ABNORMAL LOW (ref 13.0–18.0)
Lymphocyte %: 5.8 %
Monocyte %: 3.7 %
Neutrophil #: 5.2 10*3/uL (ref 1.4–6.5)
RBC: 2.43 10*6/uL — ABNORMAL LOW (ref 4.40–5.90)
RDW: 18.3 % — ABNORMAL HIGH (ref 11.5–14.5)
WBC: 5.8 10*3/uL (ref 3.8–10.6)

## 2012-08-04 LAB — URINALYSIS, COMPLETE
Bilirubin,UR: NEGATIVE
Hyaline Cast: 4
Ketone: NEGATIVE
Ph: 5 (ref 4.5–8.0)
Protein: 30
RBC,UR: NONE SEEN /HPF (ref 0–5)
Specific Gravity: 1.005 (ref 1.003–1.030)
Squamous Epithelial: <1
WBC UR: <1 /HPF (ref 0–5)

## 2012-08-04 LAB — CBC
HCT: 29.9 % — ABNORMAL LOW (ref 40.0–52.0)
HGB: 9.8 g/dL — ABNORMAL LOW (ref 13.0–18.0)
RDW: 18.5 % — ABNORMAL HIGH (ref 11.5–14.5)
WBC: 5 10*3/uL (ref 3.8–10.6)

## 2012-08-04 LAB — COMPREHENSIVE METABOLIC PANEL
Albumin: 3.7 g/dL (ref 3.4–5.0)
Albumin: 2.1 g/dL — ABNORMAL LOW (ref 3.4–5.0)
Alkaline Phosphatase: 58 U/L (ref 50–136)
Anion Gap: 9 (ref 7–16)
BUN: 4 mg/dL — ABNORMAL LOW (ref 7–18)
Bilirubin,Total: 0.4 mg/dL (ref 0.2–1.0)
Calcium, Total: 9.1 mg/dL (ref 8.5–10.1)
Chloride: 107 mmol/L (ref 98–107)
Creatinine: 0.64 mg/dL (ref 0.60–1.30)
Osmolality: 260 (ref 275–301)
Potassium: 4.4 mmol/L (ref 3.5–5.1)
SGPT (ALT): 28 U/L (ref 12–78)
Sodium: 140 mmol/L (ref 136–145)
Total Protein: 5.2 g/dL — ABNORMAL LOW (ref 6.4–8.2)

## 2012-08-04 LAB — LIPASE, BLOOD: Lipase: 361 U/L (ref 73–393)

## 2012-08-04 LAB — ETHANOL
Ethanol %: 0.27 % — ABNORMAL HIGH (ref 0.000–0.080)
Ethanol: 270 mg/dL

## 2012-08-05 LAB — CBC WITH DIFFERENTIAL/PLATELET
Eosinophil %: 0.1 %
Lymphocyte %: 3.6 %
MCH: 26.5 pg (ref 26.0–34.0)
Monocyte #: 0.5 x10 3/mm (ref 0.2–1.0)
Neutrophil %: 89.5 %
Platelet: 55 10*3/uL — ABNORMAL LOW (ref 150–440)
RBC: 3.18 10*6/uL — ABNORMAL LOW (ref 4.40–5.90)
WBC: 7.3 10*3/uL (ref 3.8–10.6)

## 2012-08-05 LAB — COMPREHENSIVE METABOLIC PANEL
Albumin: 2.1 g/dL — ABNORMAL LOW (ref 3.4–5.0)
Anion Gap: 10 (ref 7–16)
Calcium, Total: 7.3 mg/dL — ABNORMAL LOW (ref 8.5–10.1)
Co2: 24 mmol/L (ref 21–32)
EGFR (African American): >60
Glucose: 185 mg/dL — ABNORMAL HIGH (ref 65–99)
SGOT(AST): 56 U/L — ABNORMAL HIGH (ref 15–37)

## 2012-08-06 LAB — CBC WITH DIFFERENTIAL/PLATELET
Basophil #: 0 10*3/uL (ref 0.0–0.1)
Basophil %: 0.2 %
Eosinophil %: 0 %
HCT: 23.5 % — ABNORMAL LOW (ref 40.0–52.0)
Lymphocyte #: 0.7 10*3/uL — ABNORMAL LOW (ref 1.0–3.6)
Lymphocyte %: 13.7 %
MCV: 82 fL (ref 80–100)
Monocyte %: 11.4 %
Monocyte %: 14.3 %
Neutrophil #: 6.3 10*3/uL (ref 1.4–6.5)
Neutrophil %: 70.6 %
Platelet: 94 10*3/uL — ABNORMAL LOW (ref 150–440)
RBC: 2.53 10*6/uL — ABNORMAL LOW (ref 4.40–5.90)
RDW: 17.3 % — ABNORMAL HIGH (ref 11.5–14.5)
WBC: 7.9 10*3/uL (ref 3.8–10.6)
WBC: 5.4 10*3/uL (ref 3.8–10.6)

## 2012-08-06 LAB — PROTIME-INR
INR: 0.9
Prothrombin Time: 12.7 s

## 2012-08-06 LAB — URINALYSIS, COMPLETE
Bacteria: NONE SEEN
Bilirubin,UR: NEGATIVE
Glucose,UR: NEGATIVE mg/dL
Ketone: NEGATIVE
Leukocyte Esterase: NEGATIVE
Nitrite: NEGATIVE
Ph: 5
Protein: 30
RBC,UR: 3 /HPF
Specific Gravity: 1.021
Squamous Epithelial: <1
WBC UR: 2 /HPF

## 2012-08-06 LAB — COMPREHENSIVE METABOLIC PANEL
BUN: 7 mg/dL (ref 7–18)
Bilirubin,Total: 0.6 mg/dL (ref 0.2–1.0)
Chloride: 106 mmol/L (ref 98–107)
Creatinine: 0.74 mg/dL (ref 0.60–1.30)
EGFR (Non-African Amer.): >60
SGPT (ALT): 39 U/L (ref 12–78)
Total Protein: 5.8 g/dL — ABNORMAL LOW (ref 6.4–8.2)

## 2012-08-08 LAB — COMPREHENSIVE METABOLIC PANEL
Anion Gap: 5 — ABNORMAL LOW (ref 7–16)
Calcium, Total: 7.6 mg/dL — ABNORMAL LOW (ref 8.5–10.1)
Chloride: 102 mmol/L (ref 98–107)
Co2: 30 mmol/L (ref 21–32)
EGFR (African American): >60
EGFR (Non-African Amer.): >60
Osmolality: 272 (ref 275–301)
Potassium: 3.3 mmol/L — ABNORMAL LOW (ref 3.5–5.1)
Sodium: 137 mmol/L (ref 136–145)

## 2012-08-08 LAB — CBC WITH DIFFERENTIAL/PLATELET
Basophil %: 0.3 %
Eosinophil #: 0.1 10*3/uL (ref 0.0–0.7)
Eosinophil %: 0.9 %
HGB: 7 g/dL — ABNORMAL LOW (ref 13.0–18.0)
Lymphocyte %: 5.8 %
MCHC: 33.1 g/dL (ref 32.0–36.0)
Neutrophil %: 71.4 %
RBC: 2.55 10*6/uL — ABNORMAL LOW (ref 4.40–5.90)

## 2012-08-09 LAB — BASIC METABOLIC PANEL
Calcium, Total: 7.6 mg/dL — ABNORMAL LOW (ref 8.5–10.1)
Co2: 28 mmol/L (ref 21–32)
Glucose: 119 mg/dL — ABNORMAL HIGH (ref 65–99)
Osmolality: 274 (ref 275–301)
Sodium: 138 mmol/L (ref 136–145)

## 2012-08-09 LAB — CBC WITH DIFFERENTIAL/PLATELET
Eosinophil %: 1.2 %
HCT: 22.4 % — ABNORMAL LOW (ref 40.0–52.0)
Lymphocyte #: 0.7 10*3/uL — ABNORMAL LOW (ref 1.0–3.6)
MCH: 26.3 pg (ref 26.0–34.0)
MCV: 80 fL (ref 80–100)
Monocyte #: 1 x10 3/mm (ref 0.2–1.0)
Neutrophil #: 4.8 10*3/uL (ref 1.4–6.5)
Platelet: 137 10*3/uL — ABNORMAL LOW (ref 150–440)
RBC: 2.8 10*6/uL — ABNORMAL LOW (ref 4.40–5.90)
WBC: 6.7 10*3/uL (ref 3.8–10.6)

## 2012-08-09 LAB — COMPREHENSIVE METABOLIC PANEL
Albumin: 2 g/dL — ABNORMAL LOW (ref 3.4–5.0)
Alkaline Phosphatase: 61 U/L (ref 50–136)
BUN: 4 mg/dL — ABNORMAL LOW (ref 7–18)
Bilirubin,Total: 0.5 mg/dL (ref 0.2–1.0)
Chloride: 99 mmol/L (ref 98–107)
Creatinine: 0.52 mg/dL — ABNORMAL LOW (ref 0.60–1.30)
SGOT(AST): 50 U/L — ABNORMAL HIGH (ref 15–37)
SGPT (ALT): 41 U/L (ref 12–78)
Total Protein: 6.1 g/dL — ABNORMAL LOW (ref 6.4–8.2)

## 2012-08-10 LAB — CBC WITH DIFFERENTIAL/PLATELET
Bands: 6 %
Eosinophil: 2 %
HGB: 7.3 g/dL — ABNORMAL LOW (ref 13.0–18.0)
Lymphocytes: 8 %
MCH: 26.8 pg (ref 26.0–34.0)
Monocytes: 27 %
RBC: 2.72 10*6/uL — ABNORMAL LOW (ref 4.40–5.90)
WBC: 7.8 10*3/uL (ref 3.8–10.6)

## 2012-08-10 LAB — COMPREHENSIVE METABOLIC PANEL
Albumin: 2 g/dL — ABNORMAL LOW (ref 3.4–5.0)
Bilirubin,Total: 0.5 mg/dL (ref 0.2–1.0)
Chloride: 100 mmol/L (ref 98–107)
EGFR (African American): >60
Glucose: 115 mg/dL — ABNORMAL HIGH (ref 65–99)
Potassium: 3 mmol/L — ABNORMAL LOW (ref 3.5–5.1)
SGPT (ALT): 40 U/L (ref 12–78)
Total Protein: 6.2 g/dL — ABNORMAL LOW (ref 6.4–8.2)

## 2012-08-11 LAB — BASIC METABOLIC PANEL
Anion Gap: 5 — ABNORMAL LOW (ref 7–16)
Calcium, Total: 8.5 mg/dL (ref 8.5–10.1)
Chloride: 100 mmol/L (ref 98–107)
Co2: 31 mmol/L (ref 21–32)
Glucose: 106 mg/dL — ABNORMAL HIGH (ref 65–99)
Osmolality: 270 (ref 275–301)
Potassium: 3 mmol/L — ABNORMAL LOW (ref 3.5–5.1)

## 2012-08-11 LAB — CBC WITH DIFFERENTIAL/PLATELET
Basophil %: 0.9 %
Eosinophil #: 0.1 10*3/uL (ref 0.0–0.7)
Eosinophil %: 0.7 %
HGB: 7.2 g/dL — ABNORMAL LOW (ref 13.0–18.0)
Lymphocyte %: 10.3 %
Neutrophil %: 71.3 %
Platelet: 283 10*3/uL (ref 150–440)
RBC: 2.78 10*6/uL — ABNORMAL LOW (ref 4.40–5.90)
WBC: 9.7 10*3/uL (ref 3.8–10.6)

## 2012-08-12 LAB — CBC WITH DIFFERENTIAL/PLATELET
Basophil #: 0.1 10*3/uL (ref 0.0–0.1)
Basophil %: 0.6 %
Eosinophil #: 0.1 10*3/uL (ref 0.0–0.7)
Eosinophil %: 0.5 %
HCT: 21.9 % — ABNORMAL LOW (ref 40.0–52.0)
HGB: 7 g/dL — ABNORMAL LOW (ref 13.0–18.0)
Lymphocyte #: 1 10*3/uL (ref 1.0–3.6)
Lymphocyte %: 8.1 %
MCH: 25.8 pg — ABNORMAL LOW (ref 26.0–34.0)
MCHC: 32 g/dL (ref 32.0–36.0)
MCV: 81 fL (ref 80–100)
Monocyte #: 1.8 x10 3/mm — ABNORMAL HIGH (ref 0.2–1.0)
Monocyte %: 14.8 %
Neutrophil #: 9.5 10*3/uL — ABNORMAL HIGH (ref 1.4–6.5)
Neutrophil %: 76 %
Platelet: 320 10*3/uL (ref 150–440)
RBC: 2.72 10*6/uL — ABNORMAL LOW (ref 4.40–5.90)
RDW: 17.2 % — ABNORMAL HIGH (ref 11.5–14.5)
WBC: 12.5 10*3/uL — ABNORMAL HIGH (ref 3.8–10.6)

## 2012-08-12 LAB — COMPREHENSIVE METABOLIC PANEL
Alkaline Phosphatase: 64 U/L (ref 50–136)
Bilirubin,Total: 0.4 mg/dL (ref 0.2–1.0)
Creatinine: 0.75 mg/dL (ref 0.60–1.30)
EGFR (African American): >60
EGFR (Non-African Amer.): >60
Glucose: 97 mg/dL (ref 65–99)
Potassium: 3.2 mmol/L — ABNORMAL LOW (ref 3.5–5.1)
SGPT (ALT): 26 U/L (ref 12–78)
Sodium: 136 mmol/L (ref 136–145)

## 2012-08-12 LAB — CULTURE, BLOOD (SINGLE)

## 2012-08-13 LAB — URINALYSIS, COMPLETE
Bilirubin,UR: NEGATIVE
Blood: NEGATIVE
Glucose,UR: NEGATIVE mg/dL (ref 0–75)
Leukocyte Esterase: NEGATIVE
Nitrite: NEGATIVE
Ph: 8 (ref 4.5–8.0)
RBC,UR: <1 /HPF (ref 0–5)

## 2012-08-13 LAB — CBC WITH DIFFERENTIAL/PLATELET
Basophil %: 1 %
Eosinophil #: 0.1 10*3/uL (ref 0.0–0.7)
Eosinophil %: 0.5 %
HCT: 22 % — ABNORMAL LOW (ref 40.0–52.0)
HGB: 6.9 g/dL — ABNORMAL LOW (ref 13.0–18.0)
MCHC: 31.4 g/dL — ABNORMAL LOW (ref 32.0–36.0)
Neutrophil #: 9.8 10*3/uL — ABNORMAL HIGH (ref 1.4–6.5)
Platelet: 368 10*3/uL (ref 150–440)
RDW: 17.1 % — ABNORMAL HIGH (ref 11.5–14.5)

## 2012-08-13 LAB — BASIC METABOLIC PANEL
Calcium, Total: 8.4 mg/dL — ABNORMAL LOW (ref 8.5–10.1)
Chloride: 101 mmol/L (ref 98–107)
Co2: 29 mmol/L (ref 21–32)
Creatinine: 0.58 mg/dL — ABNORMAL LOW (ref 0.60–1.30)
Potassium: 3.4 mmol/L — ABNORMAL LOW (ref 3.5–5.1)
Sodium: 138 mmol/L (ref 136–145)

## 2012-08-14 LAB — CBC WITH DIFFERENTIAL/PLATELET
Eosinophil %: 0.3 %
HGB: 9.4 g/dL — ABNORMAL LOW (ref 13.0–18.0)
Lymphocyte #: 1 10*3/uL (ref 1.0–3.6)
MCH: 26.6 pg (ref 26.0–34.0)
Monocyte %: 9.4 %
Neutrophil #: 10.5 10*3/uL — ABNORMAL HIGH (ref 1.4–6.5)
RBC: 3.55 10*6/uL — ABNORMAL LOW (ref 4.40–5.90)
WBC: 12.9 10*3/uL — ABNORMAL HIGH (ref 3.8–10.6)

## 2012-08-14 LAB — BASIC METABOLIC PANEL
EGFR (African American): >60
Glucose: 109 mg/dL — ABNORMAL HIGH (ref 65–99)
Potassium: 3.2 mmol/L — ABNORMAL LOW (ref 3.5–5.1)

## 2012-08-15 LAB — CBC WITH DIFFERENTIAL/PLATELET
Basophil %: 1.7 %
Eosinophil #: 0.1 10*3/uL (ref 0.0–0.7)
HGB: 8.7 g/dL — ABNORMAL LOW (ref 13.0–18.0)
MCH: 25.1 pg — ABNORMAL LOW (ref 26.0–34.0)
MCHC: 30.3 g/dL — ABNORMAL LOW (ref 32.0–36.0)
Monocyte #: 1 x10 3/mm (ref 0.2–1.0)
Neutrophil %: 75.4 %
WBC: 11.1 10*3/uL — ABNORMAL HIGH (ref 3.8–10.6)

## 2012-08-15 LAB — BASIC METABOLIC PANEL
BUN: 4 mg/dL — ABNORMAL LOW (ref 7–18)
Calcium, Total: 8.3 mg/dL — ABNORMAL LOW (ref 8.5–10.1)
Creatinine: 0.52 mg/dL — ABNORMAL LOW (ref 0.60–1.30)
EGFR (Non-African Amer.): >60
Glucose: 92 mg/dL (ref 65–99)
Potassium: 3.8 mmol/L (ref 3.5–5.1)
Sodium: 138 mmol/L (ref 136–145)

## 2012-08-17 LAB — CULTURE, BLOOD (SINGLE)

## 2012-08-23 ENCOUNTER — Encounter: Payer: Self-pay | Admitting: Nurse Practitioner

## 2012-08-23 ENCOUNTER — Encounter: Payer: Self-pay | Admitting: Cardiothoracic Surgery

## 2012-09-06 ENCOUNTER — Encounter: Payer: Self-pay | Admitting: Nurse Practitioner

## 2012-09-06 ENCOUNTER — Encounter: Payer: Self-pay | Admitting: Cardiothoracic Surgery

## 2012-10-07 ENCOUNTER — Encounter: Payer: Self-pay | Admitting: Nurse Practitioner

## 2012-10-07 ENCOUNTER — Encounter: Payer: Self-pay | Admitting: Cardiothoracic Surgery

## 2012-10-08 ENCOUNTER — Emergency Department: Payer: Self-pay | Admitting: Emergency Medicine

## 2012-10-09 LAB — CBC
HCT: 31.2 % — ABNORMAL LOW (ref 40.0–52.0)
HGB: 9.7 g/dL — ABNORMAL LOW (ref 13.0–18.0)
MCH: 24.4 pg — ABNORMAL LOW (ref 26.0–34.0)
MCHC: 31.2 g/dL — ABNORMAL LOW (ref 32.0–36.0)
MCV: 78 fL — ABNORMAL LOW (ref 80–100)
Platelet: 73 10*3/uL — ABNORMAL LOW (ref 150–440)

## 2012-10-09 LAB — COMPREHENSIVE METABOLIC PANEL
Albumin: 3.5 g/dL (ref 3.4–5.0)
Anion Gap: 12 (ref 7–16)
BUN: 2 mg/dL — ABNORMAL LOW (ref 7–18)
Bilirubin,Total: 0.2 mg/dL (ref 0.2–1.0)
Chloride: 104 mmol/L (ref 98–107)
Creatinine: 0.55 mg/dL — ABNORMAL LOW (ref 0.60–1.30)
EGFR (African American): >60
EGFR (Non-African Amer.): >60
Osmolality: 274 (ref 275–301)
Potassium: 4 mmol/L (ref 3.5–5.1)
SGOT(AST): 100 U/L — ABNORMAL HIGH (ref 15–37)
Sodium: 139 mmol/L (ref 136–145)
Total Protein: 8.8 g/dL — ABNORMAL HIGH (ref 6.4–8.2)

## 2013-01-11 ENCOUNTER — Emergency Department: Payer: Self-pay | Admitting: Emergency Medicine

## 2013-01-23 ENCOUNTER — Emergency Department: Payer: Self-pay | Admitting: Emergency Medicine

## 2013-01-26 IMAGING — CR DG ABDOMEN 1V
1 series · 1 of 1 positions shown · non-contrast
Comparison: none

REASON FOR EXAM: Distended, evaluate gastric distension
COMMENTS:

[ap]
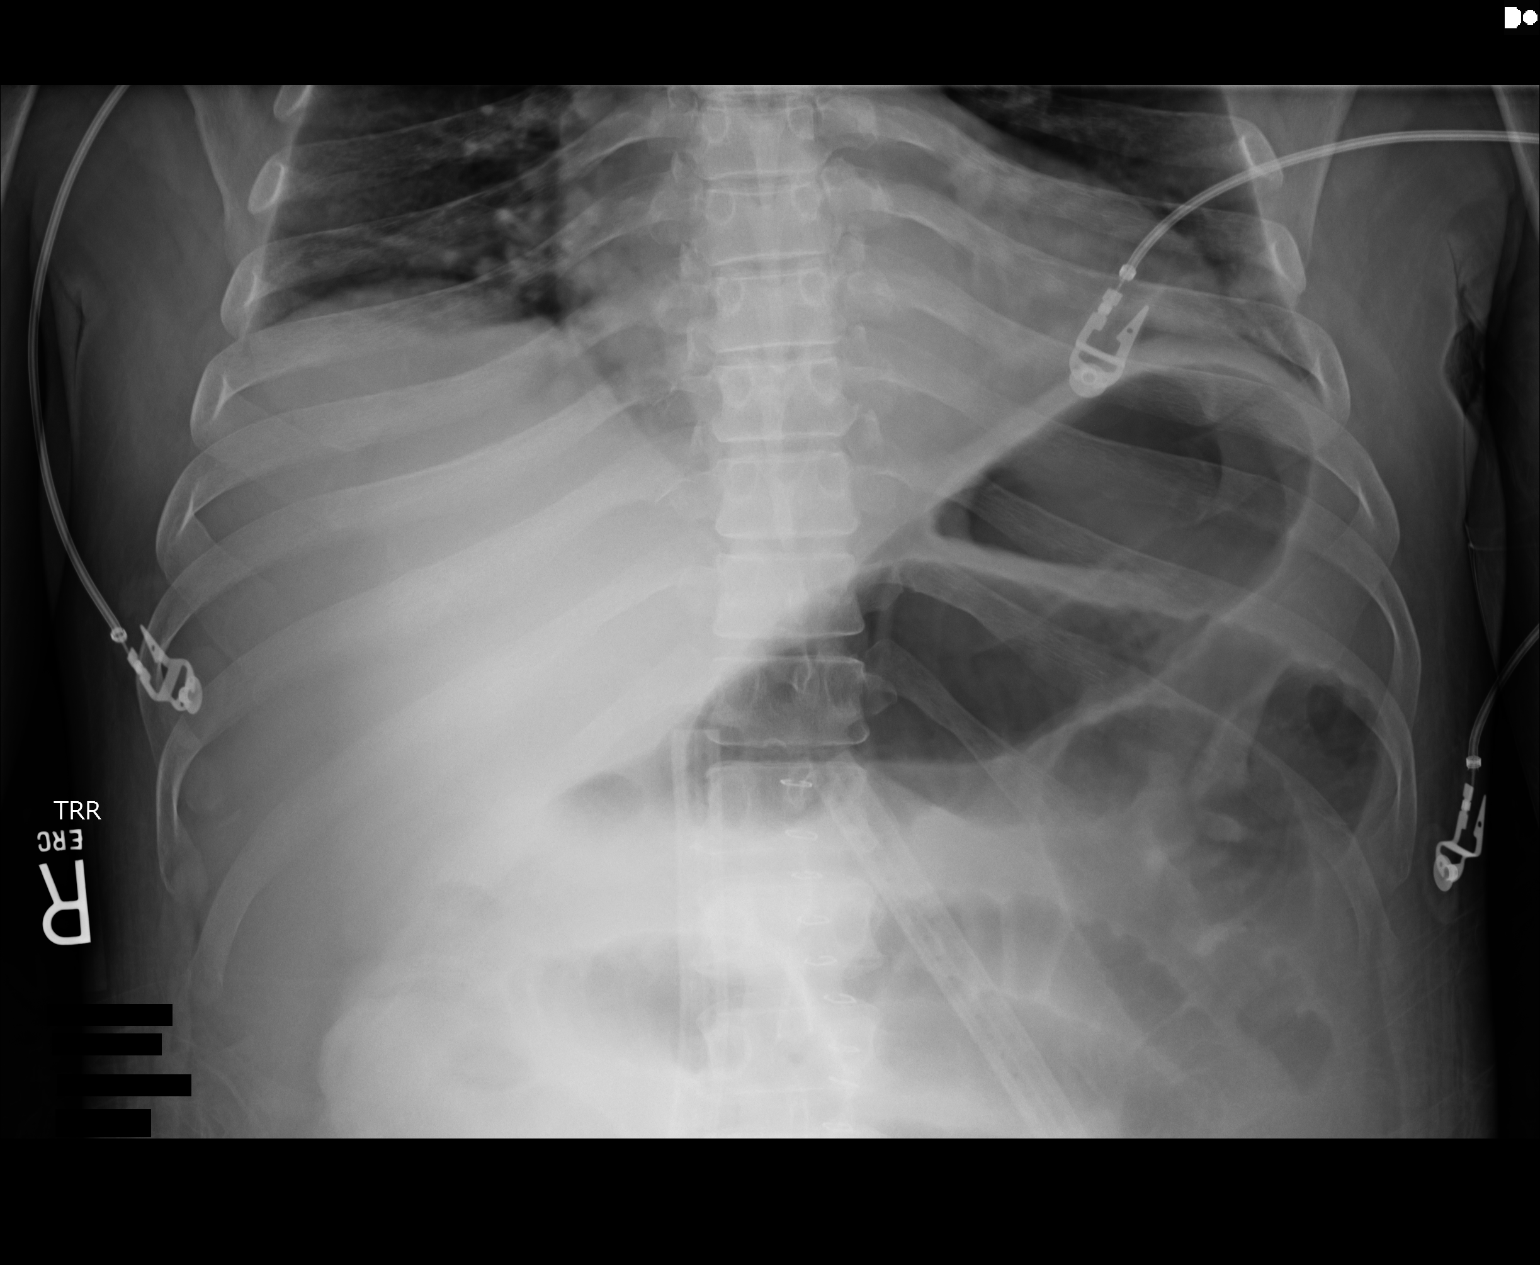

[1 of 1 positions shown; findings below may reference images not displayed]

PROCEDURE:     DXR - DXR ABDOMEN AP ONLY  - August 06, 2012 [DATE]

RESULT:     Single upright view of the abdomen shows moderate amount of air
within the stomach. There appear to be some air-filled distended loops of
small bowel with some small air-fluid levels concerning for ileus versus
obstruction. Complete abdominal series would be helpful. The lung bases
appear relatively clear. Cardiac monitoring electrodes are present.
IMPRESSION: 1. Limited study. Findings concerning for bowel obstruction versus ileus.

[REDACTED]

## 2013-02-19 ENCOUNTER — Emergency Department: Payer: Self-pay | Admitting: Unknown Physician Specialty

## 2013-02-19 LAB — CBC
HCT: 28.3 % — ABNORMAL LOW (ref 40.0–52.0)
MCH: 23.9 pg — ABNORMAL LOW (ref 26.0–34.0)
MCHC: 31.4 g/dL — ABNORMAL LOW (ref 32.0–36.0)
Platelet: 44 10*3/uL — ABNORMAL LOW (ref 150–440)
RDW: 19.8 % — ABNORMAL HIGH (ref 11.5–14.5)
WBC: 2.7 10*3/uL — ABNORMAL LOW (ref 3.8–10.6)

## 2013-02-19 LAB — ETHANOL
Ethanol %: 0.432 % (ref 0.000–0.080)
Ethanol: 432 mg/dL

## 2013-02-19 LAB — COMPREHENSIVE METABOLIC PANEL
Albumin: 3.3 g/dL — ABNORMAL LOW (ref 3.4–5.0)
BUN: 5 mg/dL — ABNORMAL LOW (ref 7–18)
Bilirubin,Total: 0.3 mg/dL (ref 0.2–1.0)
Calcium, Total: 7.9 mg/dL — ABNORMAL LOW (ref 8.5–10.1)
Chloride: 107 mmol/L (ref 98–107)
Co2: 26 mmol/L (ref 21–32)
EGFR (Non-African Amer.): >60
Glucose: 109 mg/dL — ABNORMAL HIGH (ref 65–99)
SGOT(AST): 176 U/L — ABNORMAL HIGH (ref 15–37)
SGPT (ALT): 73 U/L (ref 12–78)
Sodium: 140 mmol/L (ref 136–145)

## 2013-02-19 LAB — LIPASE, BLOOD: Lipase: 415 U/L — ABNORMAL HIGH (ref 73–393)

## 2013-02-20 ENCOUNTER — Emergency Department: Payer: Self-pay | Admitting: Emergency Medicine

## 2013-02-22 ENCOUNTER — Emergency Department: Payer: Self-pay | Admitting: Emergency Medicine

## 2013-04-27 ENCOUNTER — Emergency Department: Payer: Self-pay | Admitting: Emergency Medicine

## 2013-04-27 LAB — CBC
HCT: 25.5 % — ABNORMAL LOW (ref 40.0–52.0)
HGB: 8.1 g/dL — ABNORMAL LOW (ref 13.0–18.0)
MCH: 24 pg — ABNORMAL LOW (ref 26.0–34.0)
Platelet: 47 10*3/uL — ABNORMAL LOW (ref 150–440)
RDW: 19.8 % — ABNORMAL HIGH (ref 11.5–14.5)

## 2013-04-27 LAB — BASIC METABOLIC PANEL
Co2: 21 mmol/L (ref 21–32)
Creatinine: 0.4 mg/dL — ABNORMAL LOW (ref 0.60–1.30)
EGFR (African American): >60
EGFR (Non-African Amer.): >60
Glucose: 99 mg/dL (ref 65–99)
Osmolality: 257 (ref 275–301)
Potassium: 4.1 mmol/L (ref 3.5–5.1)
Sodium: 130 mmol/L — ABNORMAL LOW (ref 136–145)

## 2013-05-21 ENCOUNTER — Emergency Department: Payer: Self-pay | Admitting: Emergency Medicine

## 2013-10-17 IMAGING — CT CT HEAD WITHOUT CONTRAST
1 series · 16 of 30 positions shown, 20 images · non-contrast
Comparison: none

REASON FOR EXAM: s/p fall; head trauma, dizziness
COMMENTS:

PROCEDURE:     CT  - CT HEAD WITHOUT CONTRAST  - April 27, 2013  [DATE]
RESULT:     Comparison:  None
TECHNIQUE: Multiple axial images from the foramen magnum to the vertex were
obtained without IV contrast.

[Series 2: soft tissue · axial · 0.42mm/px · z∈[+384,+518]mm · 16 of 31 slices shown, 20 images]
[im 2/31  brain]
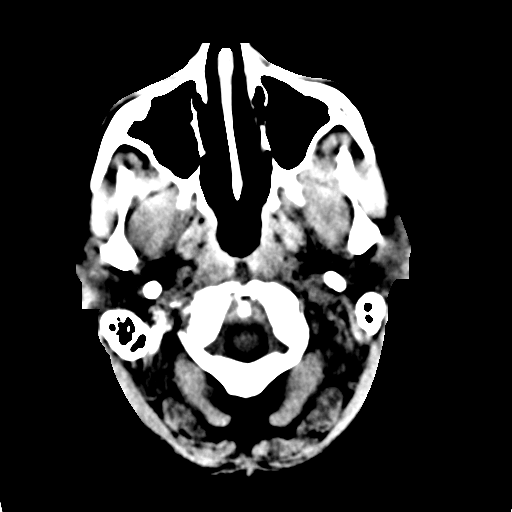
[im 2/31  bone]
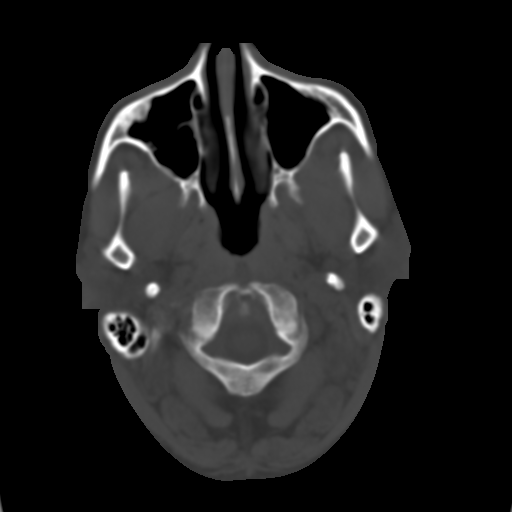
[im 4/31  brain]
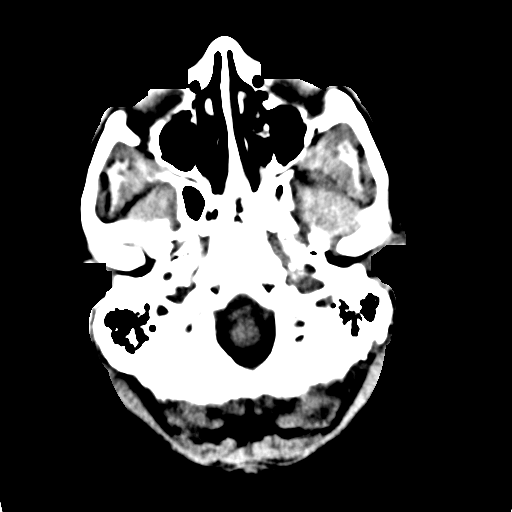
[im 6/31  brain]
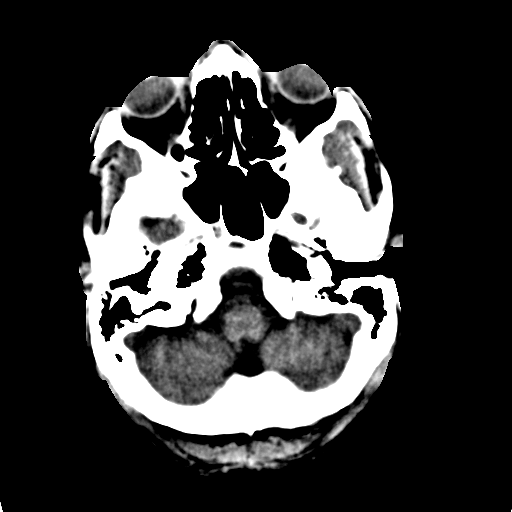
[im 8/31  brain]
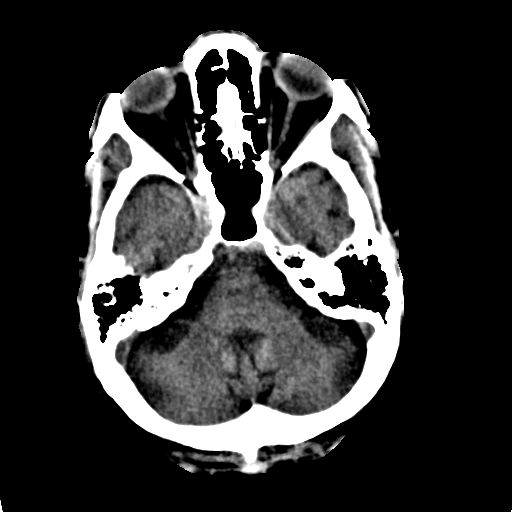
[im 9/31  brain]
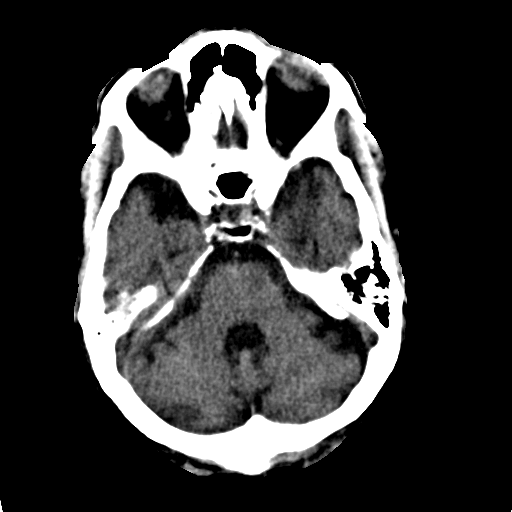
[im 9/31  bone]
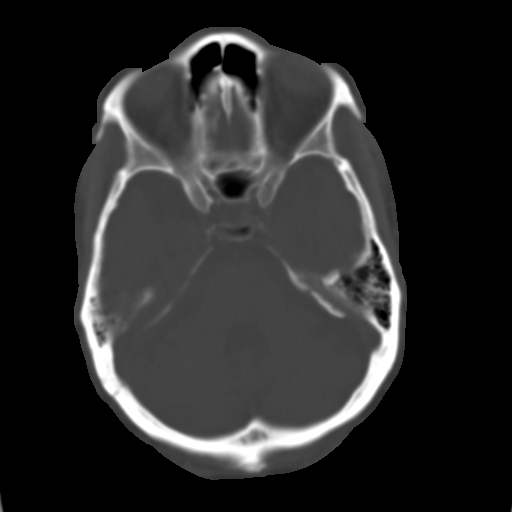
[im 11/31  brain]
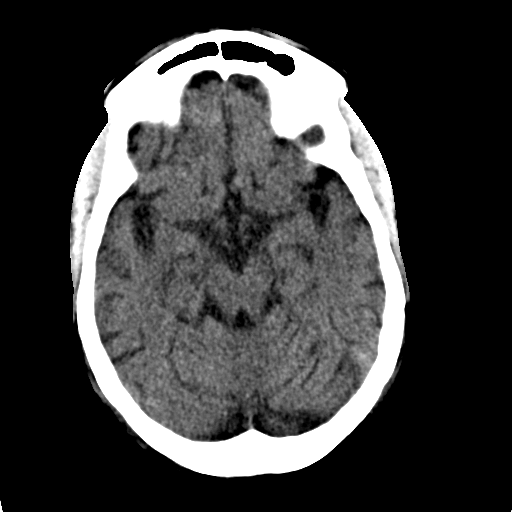
[im 13/31  brain]
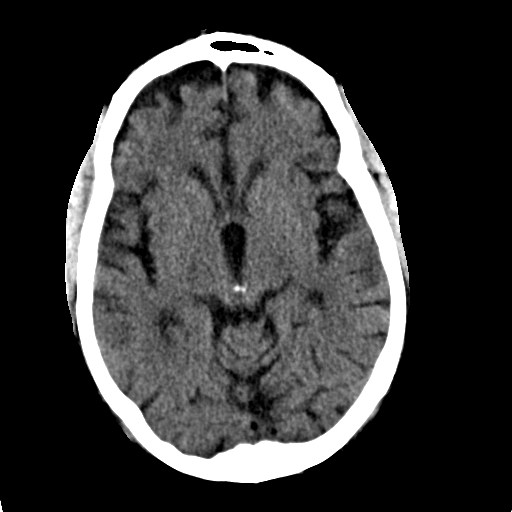
[im 15/31  brain]
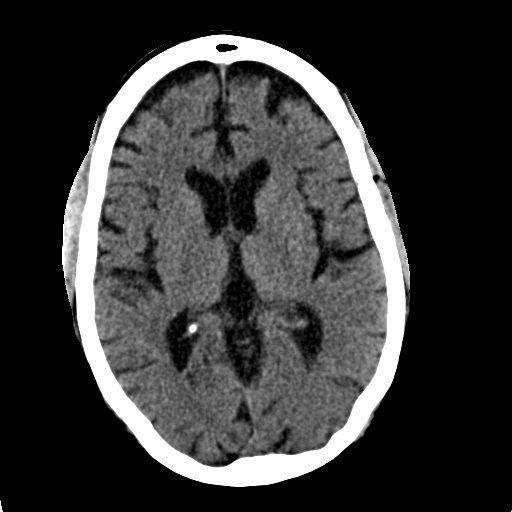
[im 16/31  brain]
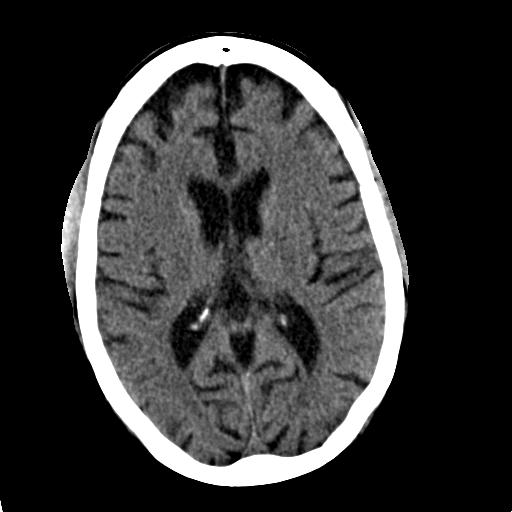
[im 16/31  bone]
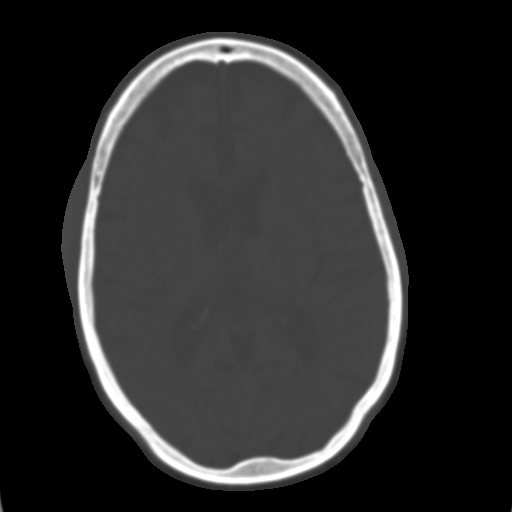
[im 18/31  brain]
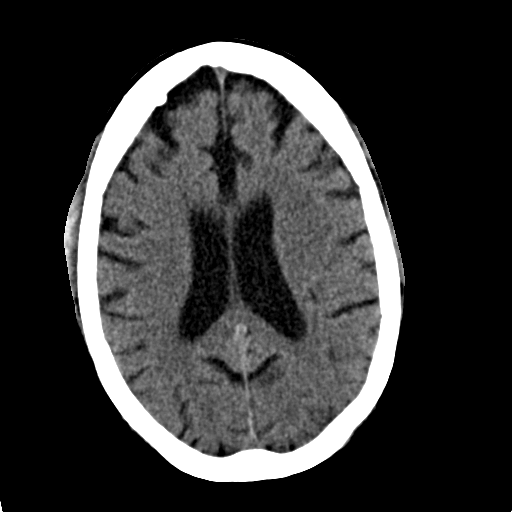
[im 20/31  brain]
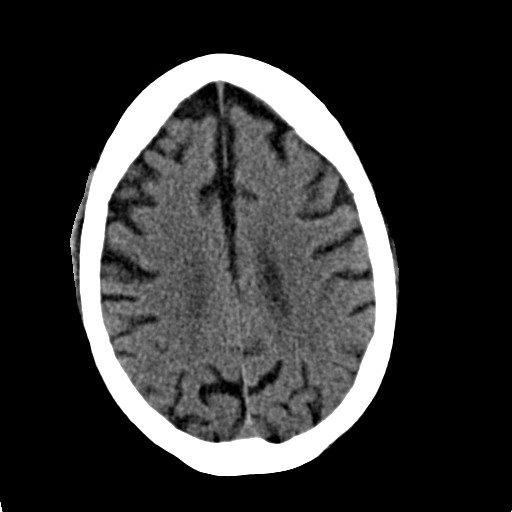
[im 22/31  brain]
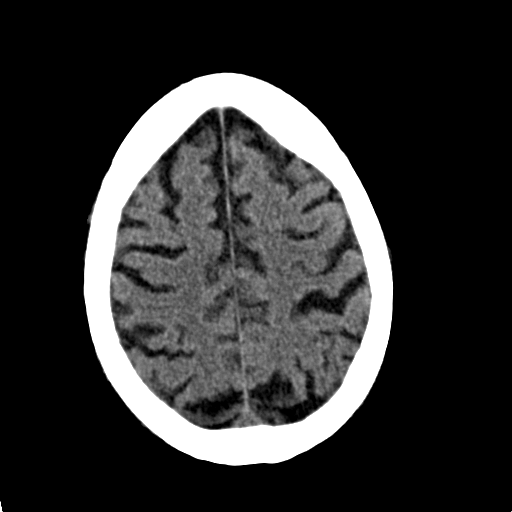
[im 23/31  brain]
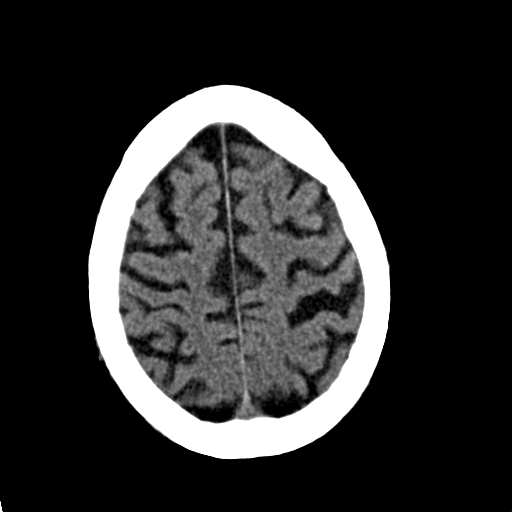
[im 23/31  bone]
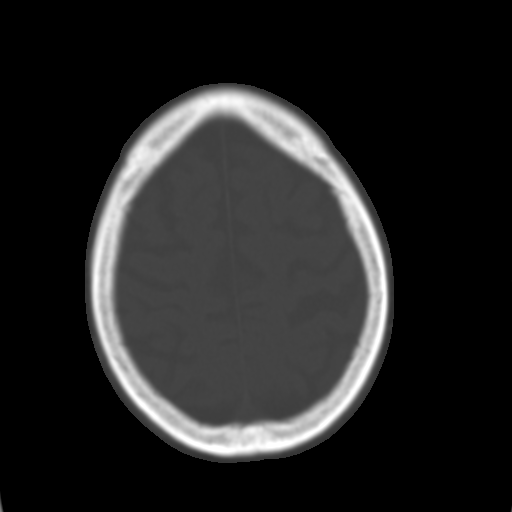
[im 25/31  brain]
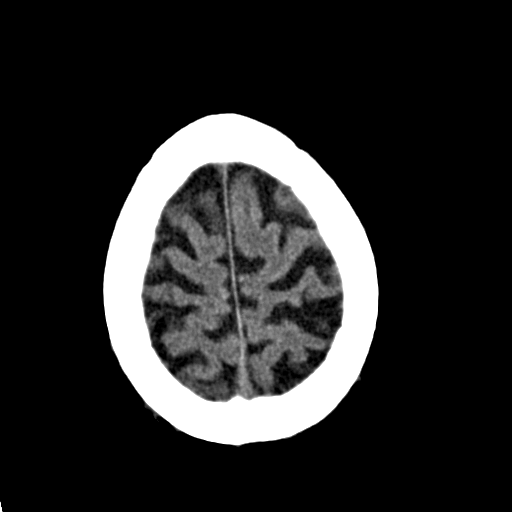
[im 27/31  brain]
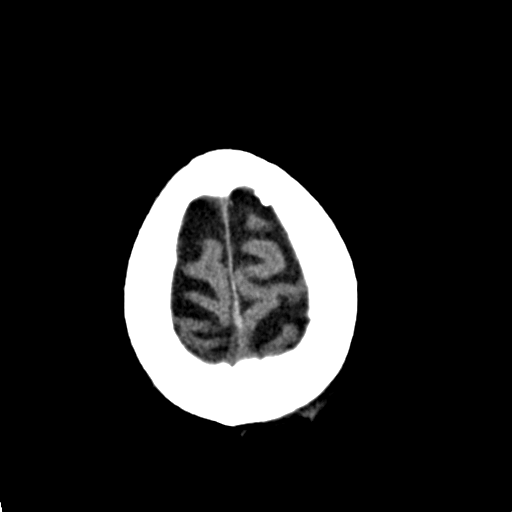
[im 29/31  brain]
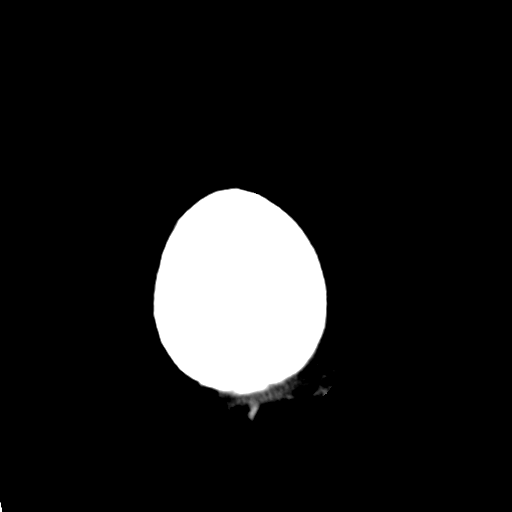

[16 of 30 positions shown; findings below may reference images not displayed]

FINDINGS: There is no evidence of mass effect, midline shift, or extra-axial fluid
collections.  There is no evidence of a space-occupying lesion or
intracranial hemorrhage. There is no evidence of a cortical-based area of
acute infarction. There is generalized cerebral atrophy.

The ventricles and sulci are appropriate for the patient's age. The basal
cisterns are patent.

Visualized portions of the orbits are unremarkable. The visualized portions
of the paranasal sinuses and mastoid air cells are unremarkable.

The osseous structures are unremarkable.
IMPRESSION: No acute intracranial process.

[REDACTED]

## 2013-10-17 IMAGING — CT CT CERVICAL SPINE WITHOUT CONTRAST
1 series · 12 of 14 positions shown, 15 images · non-contrast
Comparison: None

REASON FOR EXAM: weaknss fall with LOC
COMMENTS:

PROCEDURE:     CT  - CT CERVICAL SPINE WO  - April 27, 2013  [DATE]
RESULT:     Clinical Indication: Trauma
TECHNIQUE: Multiple axial CT images from the skull base to the mid vertebral
body of T1. obtained with sagittal and coronal reformatted images provided.

[Series 5: axial · axial · 0.31mm/px · z∈[+227,+363]mm · 12 of 89 slices shown, 15 images]
[im 7/89  soft-tissue]
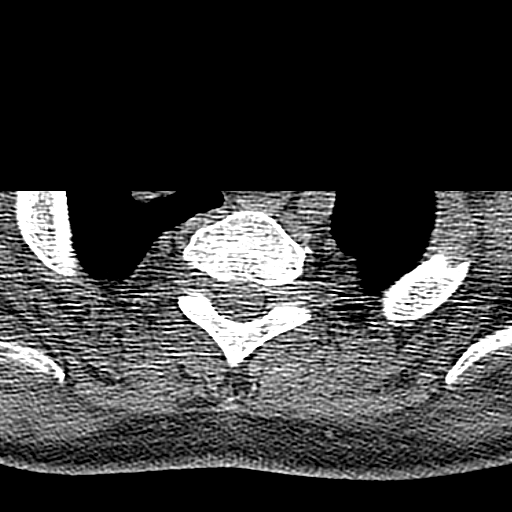
[im 7/89  bone]
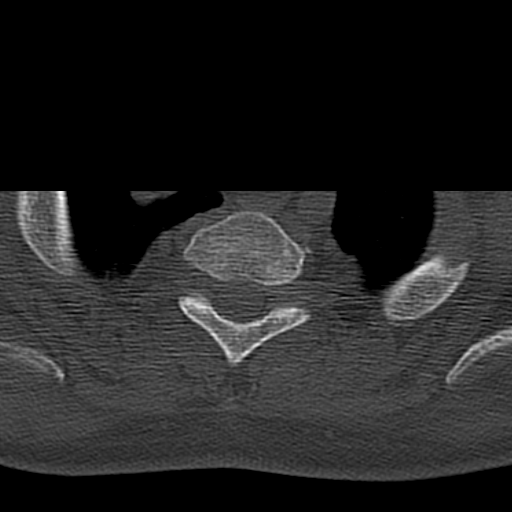
[im 14/89  bone]
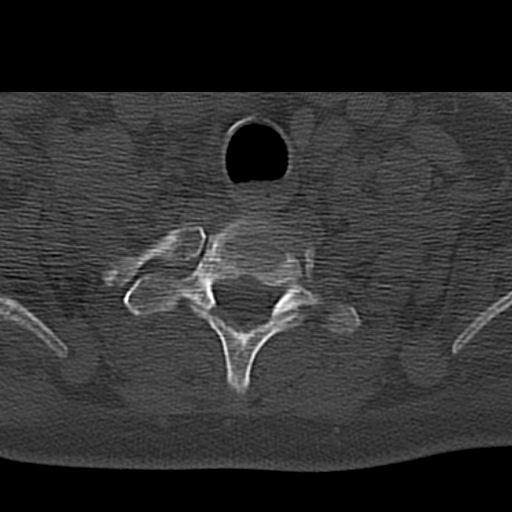
[im 21/89  bone]
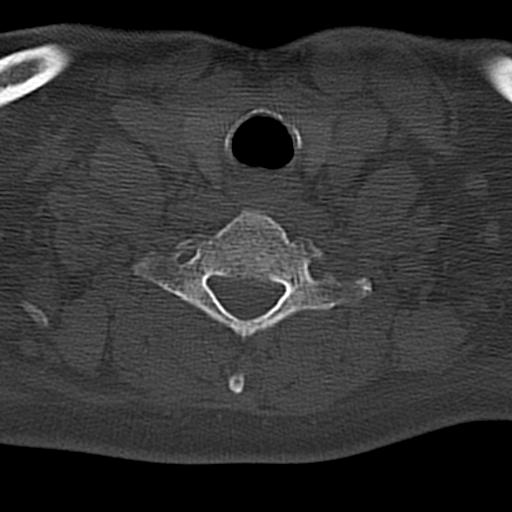
[im 28/89  bone]
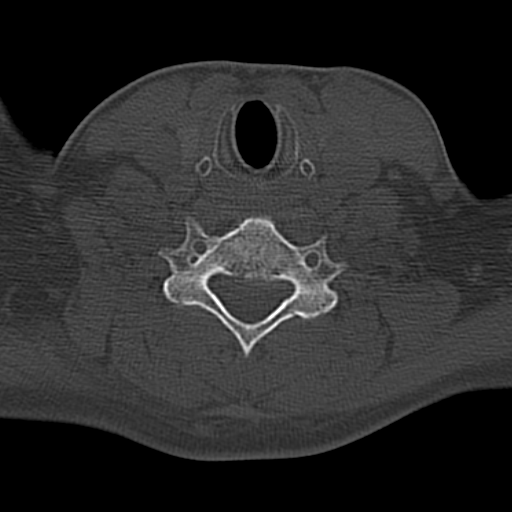
[im 34/89  soft-tissue]
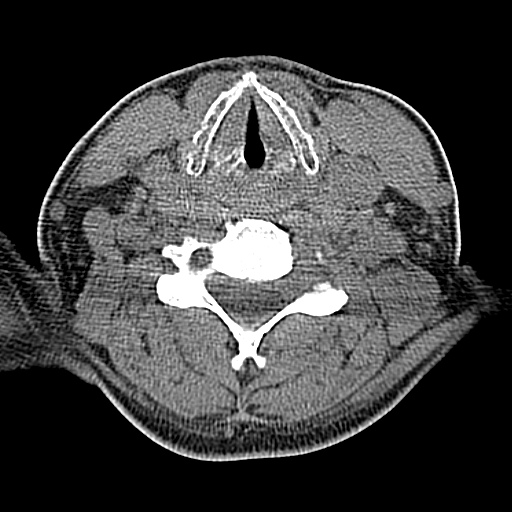
[im 34/89  bone]
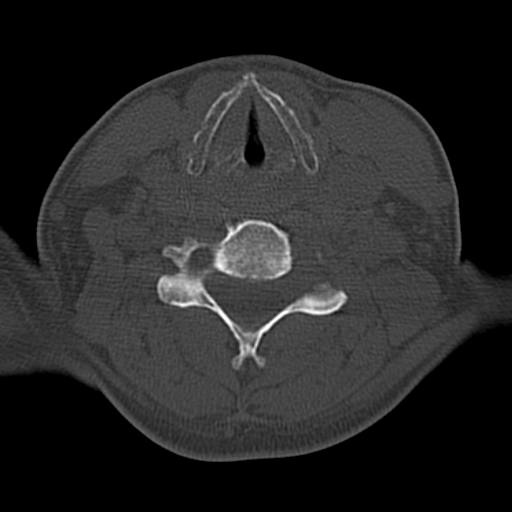
[im 41/89  bone]
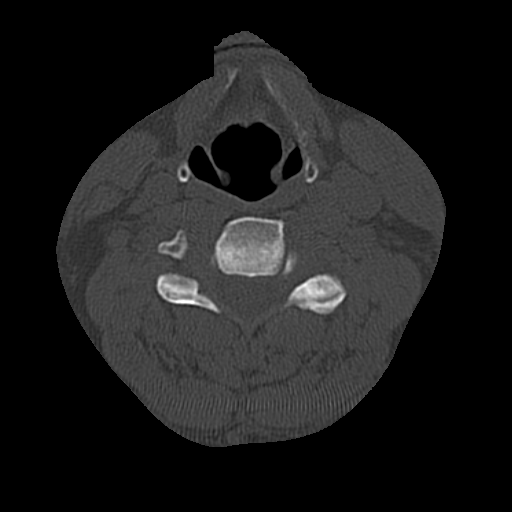
[im 48/89  bone]
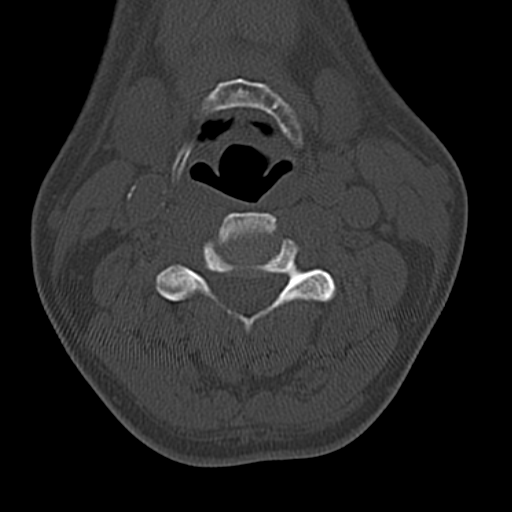
[im 55/89  bone]
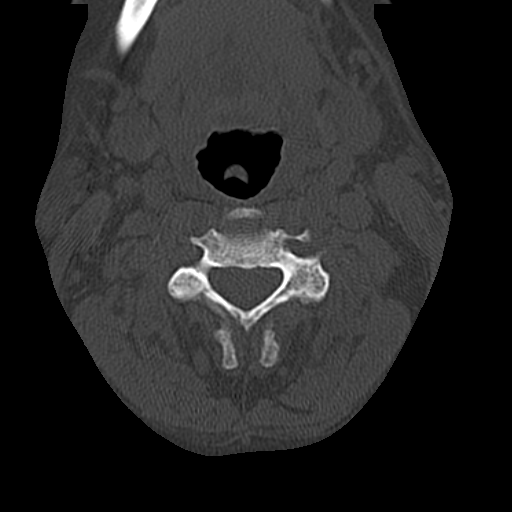
[im 61/89  soft-tissue]
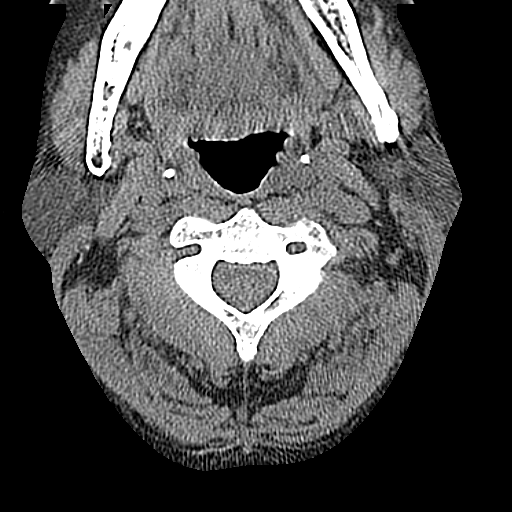
[im 61/89  bone]
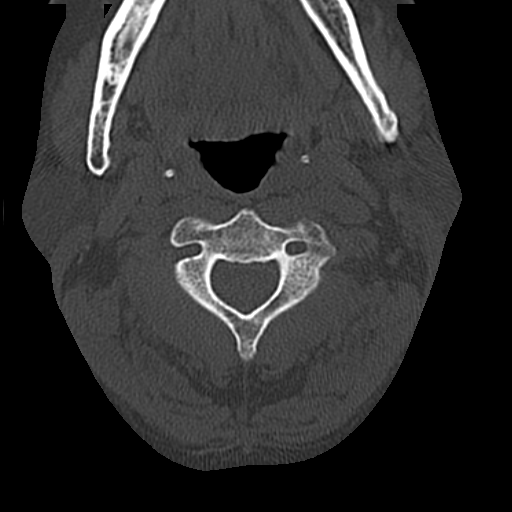
[im 68/89  bone]
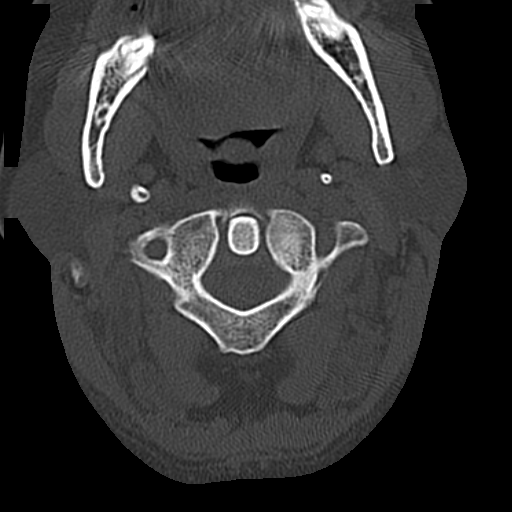
[im 75/89  bone]
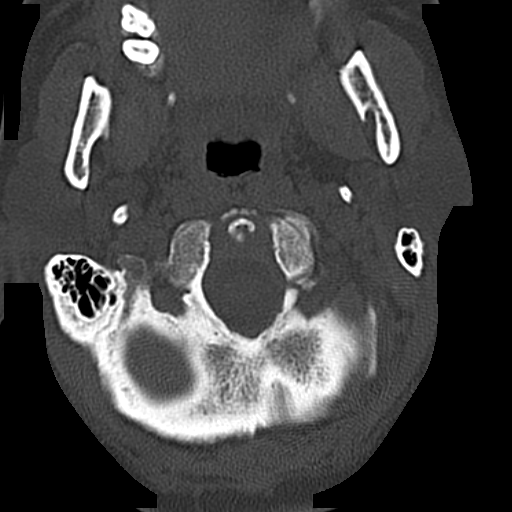
[im 82/89  bone]
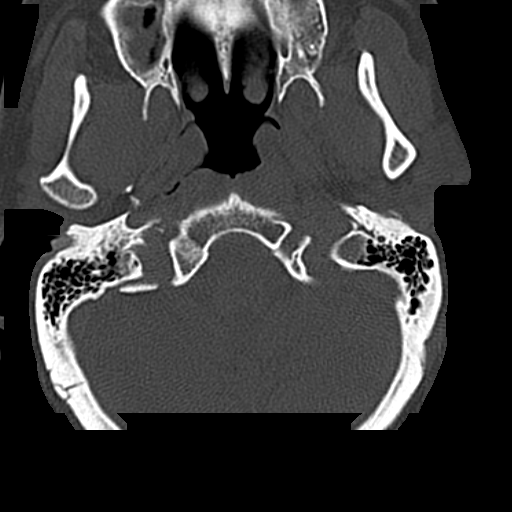

[12 of 14 positions shown; findings below may reference images not displayed]

FINDINGS: The alignment is anatomic. The vertebral body heights are maintained. There
is no acute fracture or static listhesis. The prevertebral soft tissues are
normal. The intraspinal soft tissues are not fully imaged on this
examination due to poor soft tissue contrast, but there is no soft tissue
gross abnormality.

Mild broad-based disc bulge at C5-C6.

The visualized portions of the lung apices demonstrate no focal abnormality.

There is bilateral carotid artery atherosclerosis.
IMPRESSION: 1. No acute osseous injury of the cervical spine.

2. Ligamentous injury is not evaluated. If there is high clinical concern
for ligamentous injury, consider MRI or flexion/extension radiographs as
clinically indicated and tolerated.

[REDACTED]

## 2013-10-23 LAB — DRUG SCREEN, URINE
Amphetamines, Ur Screen: NEGATIVE (ref ?–1000)
Barbiturates, Ur Screen: NEGATIVE (ref ?–200)
Cannabinoid 50 Ng, Ur ~~LOC~~: NEGATIVE (ref ?–50)
Methadone, Ur Screen: NEGATIVE (ref ?–300)
Opiate, Ur Screen: NEGATIVE (ref ?–300)
Phencyclidine (PCP) Ur S: NEGATIVE (ref ?–25)
Tricyclic, Ur Screen: NEGATIVE (ref ?–1000)

## 2013-10-23 LAB — COMPREHENSIVE METABOLIC PANEL
Albumin: 3.8 g/dL (ref 3.4–5.0)
Anion Gap: 13 (ref 7–16)
BUN: 7 mg/dL (ref 7–18)
Bilirubin,Total: 1.1 mg/dL — ABNORMAL HIGH (ref 0.2–1.0)
Calcium, Total: 9.3 mg/dL (ref 8.5–10.1)
Creatinine: 0.88 mg/dL (ref 0.60–1.30)
EGFR (African American): >60
EGFR (Non-African Amer.): >60
Glucose: 117 mg/dL — ABNORMAL HIGH (ref 65–99)
Potassium: 4 mmol/L (ref 3.5–5.1)
SGOT(AST): 168 U/L — ABNORMAL HIGH (ref 15–37)
Sodium: 137 mmol/L (ref 136–145)

## 2013-10-23 LAB — URINALYSIS, COMPLETE
Bilirubin,UR: NEGATIVE
Glucose,UR: NEGATIVE mg/dL (ref 0–75)
Hyaline Cast: 8
Leukocyte Esterase: NEGATIVE
RBC,UR: 81 /HPF (ref 0–5)
Specific Gravity: 1.012 (ref 1.003–1.030)
Squamous Epithelial: <1
WBC UR: 1 /HPF (ref 0–5)

## 2013-10-23 LAB — CBC WITH DIFFERENTIAL/PLATELET
Basophil #: 0.1 10*3/uL (ref 0.0–0.1)
Basophil %: 0.6 %
Eosinophil #: 0 10*3/uL (ref 0.0–0.7)
HCT: 31.3 % — ABNORMAL LOW (ref 40.0–52.0)
Lymphocyte #: 0.5 10*3/uL — ABNORMAL LOW (ref 1.0–3.6)
MCHC: 30.5 g/dL — ABNORMAL LOW (ref 32.0–36.0)
MCV: 76 fL — ABNORMAL LOW (ref 80–100)
Monocyte %: 10.5 %
Neutrophil #: 9 10*3/uL — ABNORMAL HIGH (ref 1.4–6.5)
Neutrophil %: 83.9 %
Platelet: 42 10*3/uL — ABNORMAL LOW (ref 150–440)
RBC: 4.15 10*6/uL — ABNORMAL LOW (ref 4.40–5.90)
WBC: 10.7 10*3/uL — ABNORMAL HIGH (ref 3.8–10.6)

## 2013-10-23 LAB — PHENYTOIN LEVEL, TOTAL: Dilantin: 1.1 ug/mL — ABNORMAL LOW (ref 10.0–20.0)

## 2013-10-23 LAB — CK: CK, Total: 955 U/L — ABNORMAL HIGH (ref 35–232)

## 2013-10-24 ENCOUNTER — Inpatient Hospital Stay: Payer: Self-pay | Admitting: Internal Medicine

## 2013-10-24 LAB — CBC WITH DIFFERENTIAL/PLATELET
Basophil #: 0 10*3/uL (ref 0.0–0.1)
Basophil %: 0.3 %
HCT: 25.6 % — ABNORMAL LOW (ref 40.0–52.0)
HGB: 8.1 g/dL — ABNORMAL LOW (ref 13.0–18.0)
Lymphocyte #: 3.8 10*3/uL — ABNORMAL HIGH (ref 1.0–3.6)
Lymphocyte %: 30.9 %
MCHC: 31.6 g/dL — ABNORMAL LOW (ref 32.0–36.0)
MCV: 73 fL — ABNORMAL LOW (ref 80–100)
Monocyte #: 0.8 x10 3/mm (ref 0.2–1.0)
Monocyte %: 6.5 %
Neutrophil %: 62.3 %
Platelet: 36 10*3/uL — ABNORMAL LOW (ref 150–440)
RBC: 3.51 10*6/uL — ABNORMAL LOW (ref 4.40–5.90)
WBC: 12.2 10*3/uL — ABNORMAL HIGH (ref 3.8–10.6)

## 2013-10-24 LAB — CK: CK, Total: 1507 U/L — ABNORMAL HIGH (ref 35–232)

## 2013-10-24 LAB — BASIC METABOLIC PANEL
BUN: 10 mg/dL (ref 7–18)
Co2: 26 mmol/L (ref 21–32)
Glucose: 88 mg/dL (ref 65–99)
Osmolality: 270 (ref 275–301)
Potassium: 3.7 mmol/L (ref 3.5–5.1)
Sodium: 136 mmol/L (ref 136–145)

## 2013-10-24 LAB — PHENYTOIN LEVEL, TOTAL: Dilantin: 14.6 ug/mL (ref 10.0–20.0)

## 2013-10-26 LAB — CK: CK, Total: 2530 U/L — ABNORMAL HIGH (ref 35–232)

## 2013-11-12 ENCOUNTER — Inpatient Hospital Stay: Payer: Self-pay | Admitting: Surgery

## 2013-11-12 LAB — CBC
HCT: 29.3 % — ABNORMAL LOW (ref 40.0–52.0)
HGB: 9.3 g/dL — ABNORMAL LOW (ref 13.0–18.0)
MCH: 24.6 pg — ABNORMAL LOW (ref 26.0–34.0)
Platelet: 386 10*3/uL (ref 150–440)
RBC: 3.79 10*6/uL — ABNORMAL LOW (ref 4.40–5.90)
RDW: 19.9 % — ABNORMAL HIGH (ref 11.5–14.5)

## 2013-11-12 LAB — COMPREHENSIVE METABOLIC PANEL
Albumin: 2.9 g/dL — ABNORMAL LOW (ref 3.4–5.0)
Alkaline Phosphatase: 98 U/L
BUN: 2 mg/dL — ABNORMAL LOW (ref 7–18)
Bilirubin,Total: 0.3 mg/dL (ref 0.2–1.0)
Co2: 25 mmol/L (ref 21–32)
EGFR (African American): >60
Glucose: 101 mg/dL — ABNORMAL HIGH (ref 65–99)
Osmolality: 276 (ref 275–301)
Potassium: 4 mmol/L (ref 3.5–5.1)
SGOT(AST): 43 U/L — ABNORMAL HIGH (ref 15–37)
Total Protein: 7.8 g/dL (ref 6.4–8.2)

## 2013-11-12 LAB — URINALYSIS, COMPLETE
Bacteria: NONE SEEN
Bilirubin,UR: NEGATIVE
Glucose,UR: NEGATIVE mg/dL (ref 0–75)
Hyaline Cast: 10
Leukocyte Esterase: NEGATIVE
Ph: 5 (ref 4.5–8.0)

## 2013-11-12 LAB — ETHANOL: Ethanol %: 0.154 % — ABNORMAL HIGH (ref 0.000–0.080)

## 2013-11-13 LAB — COMPREHENSIVE METABOLIC PANEL
Anion Gap: 5 — ABNORMAL LOW (ref 7–16)
Bilirubin,Total: 0.6 mg/dL (ref 0.2–1.0)
Calcium, Total: 8.6 mg/dL (ref 8.5–10.1)
Chloride: 100 mmol/L (ref 98–107)
Co2: 30 mmol/L (ref 21–32)
Creatinine: 0.51 mg/dL — ABNORMAL LOW (ref 0.60–1.30)
EGFR (Non-African Amer.): >60
Glucose: 119 mg/dL — ABNORMAL HIGH (ref 65–99)
Osmolality: 267 (ref 275–301)
Potassium: 3.5 mmol/L (ref 3.5–5.1)
SGOT(AST): 37 U/L (ref 15–37)
SGPT (ALT): 26 U/L (ref 12–78)
Sodium: 135 mmol/L — ABNORMAL LOW (ref 136–145)

## 2013-11-13 LAB — CBC WITH DIFFERENTIAL/PLATELET
Basophil #: 0 10*3/uL (ref 0.0–0.1)
Basophil %: 0.5 %
Eosinophil #: 0.1 10*3/uL (ref 0.0–0.7)
Eosinophil %: 1.4 %
HCT: 26.5 % — ABNORMAL LOW (ref 40.0–52.0)
HGB: 8.5 g/dL — ABNORMAL LOW (ref 13.0–18.0)
Lymphocyte #: 1.2 10*3/uL (ref 1.0–3.6)
MCH: 24.7 pg — ABNORMAL LOW (ref 26.0–34.0)
MCHC: 32.2 g/dL (ref 32.0–36.0)
Monocyte #: 0.5 x10 3/mm (ref 0.2–1.0)
Neutrophil #: 7.2 10*3/uL — ABNORMAL HIGH (ref 1.4–6.5)
Neutrophil %: 78.9 %
Platelet: 317 10*3/uL (ref 150–440)
RDW: 19.4 % — ABNORMAL HIGH (ref 11.5–14.5)

## 2013-11-14 LAB — CBC WITH DIFFERENTIAL/PLATELET
Basophil #: 0.2 10*3/uL — ABNORMAL HIGH (ref 0.0–0.1)
Eosinophil #: 0.1 10*3/uL (ref 0.0–0.7)
Eosinophil %: 0.4 %
HCT: 28.3 % — ABNORMAL LOW (ref 40.0–52.0)
HGB: 8.9 g/dL — ABNORMAL LOW (ref 13.0–18.0)
Lymphocyte #: 1.2 10*3/uL (ref 1.0–3.6)
Lymphocyte %: 9 %
MCH: 23.7 pg — ABNORMAL LOW (ref 26.0–34.0)
MCHC: 31.3 g/dL — ABNORMAL LOW (ref 32.0–36.0)
Monocyte %: 6.6 %
Neutrophil #: 10.9 10*3/uL — ABNORMAL HIGH (ref 1.4–6.5)
Neutrophil %: 82.9 %
Platelet: 297 10*3/uL (ref 150–440)

## 2013-11-14 LAB — COMPREHENSIVE METABOLIC PANEL
Albumin: 2.7 g/dL — ABNORMAL LOW (ref 3.4–5.0)
Alkaline Phosphatase: 79 U/L
Anion Gap: 6 — ABNORMAL LOW (ref 7–16)
Calcium, Total: 8.9 mg/dL (ref 8.5–10.1)
Co2: 31 mmol/L (ref 21–32)
Creatinine: 0.57 mg/dL — ABNORMAL LOW (ref 0.60–1.30)
EGFR (African American): >60
Glucose: 131 mg/dL — ABNORMAL HIGH (ref 65–99)
Potassium: 3.6 mmol/L (ref 3.5–5.1)
SGOT(AST): 19 U/L (ref 15–37)
SGPT (ALT): 22 U/L (ref 12–78)
Sodium: 134 mmol/L — ABNORMAL LOW (ref 136–145)
Total Protein: 7.2 g/dL (ref 6.4–8.2)

## 2013-11-15 LAB — CBC WITH DIFFERENTIAL/PLATELET
Basophil #: 0 10*3/uL (ref 0.0–0.1)
Basophil %: 0.1 %
Eosinophil #: 0 10*3/uL (ref 0.0–0.7)
Eosinophil %: 0 %
HCT: 29.5 % — ABNORMAL LOW (ref 40.0–52.0)
MCHC: 31.4 g/dL — ABNORMAL LOW (ref 32.0–36.0)
Monocyte #: 1.2 x10 3/mm — ABNORMAL HIGH (ref 0.2–1.0)
Monocyte %: 8.1 %
Neutrophil #: 12.1 10*3/uL — ABNORMAL HIGH (ref 1.4–6.5)
Neutrophil %: 83.2 %
Platelet: 291 10*3/uL (ref 150–440)
RBC: 3.83 10*6/uL — ABNORMAL LOW (ref 4.40–5.90)
WBC: 14.6 10*3/uL — ABNORMAL HIGH (ref 3.8–10.6)

## 2013-11-16 DIAGNOSIS — I517 Cardiomegaly: Secondary | ICD-10-CM

## 2013-11-16 LAB — CBC WITH DIFFERENTIAL/PLATELET
Basophil #: 0.1 10*3/uL (ref 0.0–0.1)
Basophil %: 0.6 %
Eosinophil #: 0.2 10*3/uL (ref 0.0–0.7)
HGB: 7.1 g/dL — ABNORMAL LOW (ref 13.0–18.0)
MCH: 23.7 pg — ABNORMAL LOW (ref 26.0–34.0)
MCHC: 31 g/dL — ABNORMAL LOW (ref 32.0–36.0)
Monocyte #: 1.3 x10 3/mm — ABNORMAL HIGH (ref 0.2–1.0)
Neutrophil %: 80.2 %
Platelet: 188 10*3/uL (ref 150–440)
RBC: 2.99 10*6/uL — ABNORMAL LOW (ref 4.40–5.90)
WBC: 14.8 10*3/uL — ABNORMAL HIGH (ref 3.8–10.6)

## 2013-11-16 LAB — BASIC METABOLIC PANEL
Anion Gap: 3 — ABNORMAL LOW (ref 7–16)
BUN: 10 mg/dL (ref 7–18)
Calcium, Total: 8.4 mg/dL — ABNORMAL LOW (ref 8.5–10.1)
Chloride: 98 mmol/L (ref 98–107)
Creatinine: 1.13 mg/dL (ref 0.60–1.30)
EGFR (African American): >60
Glucose: 130 mg/dL — ABNORMAL HIGH (ref 65–99)
Osmolality: 260 (ref 275–301)
Potassium: 3.8 mmol/L (ref 3.5–5.1)
Sodium: 129 mmol/L — ABNORMAL LOW (ref 136–145)

## 2013-11-16 LAB — CREATININE, SERUM: EGFR (Non-African Amer.): >60

## 2013-11-17 LAB — CBC WITH DIFFERENTIAL/PLATELET
Basophil #: 0 10*3/uL (ref 0.0–0.1)
Basophil %: 0.3 %
Eosinophil #: 0.2 10*3/uL (ref 0.0–0.7)
Eosinophil %: 1.8 %
HCT: 21.9 % — ABNORMAL LOW (ref 40.0–52.0)
Lymphocyte #: 1.2 10*3/uL (ref 1.0–3.6)
Lymphocyte %: 9.8 %
MCV: 78 fL — ABNORMAL LOW (ref 80–100)
Monocyte #: 0.9 x10 3/mm (ref 0.2–1.0)
Monocyte %: 7.3 %
Neutrophil #: 9.6 10*3/uL — ABNORMAL HIGH (ref 1.4–6.5)
Neutrophil %: 80.8 %
Platelet: 177 10*3/uL (ref 150–440)

## 2013-11-17 LAB — CREATININE, SERUM: EGFR (Non-African Amer.): >60

## 2013-11-18 LAB — CBC WITH DIFFERENTIAL/PLATELET
Basophil #: 0 10*3/uL (ref 0.0–0.1)
Basophil %: 0.2 %
Eosinophil #: 0.3 10*3/uL (ref 0.0–0.7)
Eosinophil %: 3.3 %
HGB: 7.7 g/dL — ABNORMAL LOW (ref 13.0–18.0)
Lymphocyte #: 1.1 10*3/uL (ref 1.0–3.6)
Lymphocyte %: 11.8 %
MCV: 78 fL — ABNORMAL LOW (ref 80–100)
Monocyte #: 0.9 x10 3/mm (ref 0.2–1.0)
Monocyte %: 9.5 %
Neutrophil #: 7.2 10*3/uL — ABNORMAL HIGH (ref 1.4–6.5)
Neutrophil %: 75.2 %
WBC: 9.5 10*3/uL (ref 3.8–10.6)

## 2013-11-20 LAB — CULTURE, BLOOD (SINGLE)

## 2013-11-22 LAB — CULTURE, BLOOD (SINGLE)

## 2013-12-09 ENCOUNTER — Emergency Department: Payer: Self-pay | Admitting: Emergency Medicine

## 2014-02-09 ENCOUNTER — Emergency Department: Payer: Self-pay

## 2014-02-09 LAB — COMPREHENSIVE METABOLIC PANEL
ALBUMIN: 3.4 g/dL (ref 3.4–5.0)
Alkaline Phosphatase: 108 U/L
Anion Gap: 7 (ref 7–16)
BILIRUBIN TOTAL: 0.2 mg/dL (ref 0.2–1.0)
BUN: 4 mg/dL — AB (ref 7–18)
CHLORIDE: 108 mmol/L — AB (ref 98–107)
Calcium, Total: 8.2 mg/dL — ABNORMAL LOW (ref 8.5–10.1)
Co2: 22 mmol/L (ref 21–32)
Creatinine: 0.61 mg/dL (ref 0.60–1.30)
EGFR (African American): >60
EGFR (Non-African Amer.): >60
GLUCOSE: 112 mg/dL — AB (ref 65–99)
Osmolality: 271 (ref 275–301)
Potassium: 3.8 mmol/L (ref 3.5–5.1)
SGOT(AST): 103 U/L — ABNORMAL HIGH (ref 15–37)
SGPT (ALT): 43 U/L (ref 12–78)
Sodium: 137 mmol/L (ref 136–145)
Total Protein: 8.7 g/dL — ABNORMAL HIGH (ref 6.4–8.2)

## 2014-02-09 LAB — URINALYSIS, COMPLETE
Bacteria: NONE SEEN
Bilirubin,UR: NEGATIVE
GLUCOSE, UR: NEGATIVE mg/dL (ref 0–75)
KETONE: NEGATIVE
Leukocyte Esterase: NEGATIVE
Nitrite: NEGATIVE
PH: 6 (ref 4.5–8.0)
Protein: NEGATIVE
RBC,UR: 1 /HPF (ref 0–5)
SQUAMOUS EPITHELIAL: NONE SEEN
Specific Gravity: 1.004 (ref 1.003–1.030)

## 2014-02-09 LAB — CBC WITH DIFFERENTIAL/PLATELET
BASOS ABS: 0.1 10*3/uL (ref 0.0–0.1)
BASOS PCT: 1.3 %
EOS ABS: 0 10*3/uL (ref 0.0–0.7)
Eosinophil %: 0.8 %
HCT: 27.9 % — ABNORMAL LOW (ref 40.0–52.0)
HGB: 8.6 g/dL — ABNORMAL LOW (ref 13.0–18.0)
Lymphocyte #: 1.3 10*3/uL (ref 1.0–3.6)
Lymphocyte %: 27.2 %
MCH: 23.4 pg — ABNORMAL LOW (ref 26.0–34.0)
MCHC: 30.7 g/dL — ABNORMAL LOW (ref 32.0–36.0)
MCV: 76 fL — ABNORMAL LOW (ref 80–100)
MONO ABS: 0.6 x10 3/mm (ref 0.2–1.0)
Monocyte %: 12.8 %
Neutrophil #: 2.8 10*3/uL (ref 1.4–6.5)
Neutrophil %: 57.9 %
PLATELETS: 60 10*3/uL — AB (ref 150–440)
RBC: 3.65 10*6/uL — AB (ref 4.40–5.90)
RDW: 19.6 % — AB (ref 11.5–14.5)
WBC: 4.8 10*3/uL (ref 3.8–10.6)

## 2014-02-10 ENCOUNTER — Emergency Department: Payer: Self-pay | Admitting: Internal Medicine

## 2014-03-01 ENCOUNTER — Emergency Department: Payer: Self-pay | Admitting: Emergency Medicine

## 2014-03-01 LAB — CBC WITH DIFFERENTIAL/PLATELET
HCT: 24.5 % — AB (ref 40.0–52.0)
HGB: 7.7 g/dL — ABNORMAL LOW (ref 13.0–18.0)
LYMPHS PCT: 27 %
MCH: 23.7 pg — ABNORMAL LOW (ref 26.0–34.0)
MCHC: 31.3 g/dL — ABNORMAL LOW (ref 32.0–36.0)
MCV: 76 fL — ABNORMAL LOW (ref 80–100)
MONOS PCT: 12 %
PLATELETS: 50 10*3/uL — AB (ref 150–440)
RBC: 3.23 10*6/uL — ABNORMAL LOW (ref 4.40–5.90)
RDW: 19.8 % — ABNORMAL HIGH (ref 11.5–14.5)
SEGMENTED NEUTROPHILS: 61 %
WBC: 2.9 10*3/uL — ABNORMAL LOW (ref 3.8–10.6)

## 2014-03-01 LAB — COMPREHENSIVE METABOLIC PANEL
ALBUMIN: 3.1 g/dL — AB (ref 3.4–5.0)
ANION GAP: 8 (ref 7–16)
Alkaline Phosphatase: 131 U/L — ABNORMAL HIGH
BUN: 4 mg/dL — AB (ref 7–18)
Bilirubin,Total: 0.3 mg/dL (ref 0.2–1.0)
CALCIUM: 7.9 mg/dL — AB (ref 8.5–10.1)
CO2: 24 mmol/L (ref 21–32)
Chloride: 107 mmol/L (ref 98–107)
Creatinine: 0.59 mg/dL — ABNORMAL LOW (ref 0.60–1.30)
EGFR (Non-African Amer.): >60
GLUCOSE: 85 mg/dL (ref 65–99)
Osmolality: 274 (ref 275–301)
Potassium: 4.1 mmol/L (ref 3.5–5.1)
SGOT(AST): 154 U/L — ABNORMAL HIGH (ref 15–37)
SGPT (ALT): 59 U/L (ref 12–78)
SODIUM: 139 mmol/L (ref 136–145)
TOTAL PROTEIN: 8.2 g/dL (ref 6.4–8.2)

## 2014-06-28 ENCOUNTER — Emergency Department: Payer: Self-pay | Admitting: Emergency Medicine

## 2014-06-28 LAB — COMPREHENSIVE METABOLIC PANEL
ALK PHOS: 209 U/L — AB
ANION GAP: 13 (ref 7–16)
Albumin: 2.3 g/dL — ABNORMAL LOW (ref 3.4–5.0)
BILIRUBIN TOTAL: 0.5 mg/dL (ref 0.2–1.0)
CALCIUM: 7.2 mg/dL — AB (ref 8.5–10.1)
CREATININE: 0.64 mg/dL (ref 0.60–1.30)
Chloride: 100 mmol/L (ref 98–107)
Co2: 25 mmol/L (ref 21–32)
EGFR (African American): >60
EGFR (Non-African Amer.): >60
Glucose: 91 mg/dL (ref 65–99)
POTASSIUM: 3.2 mmol/L — AB (ref 3.5–5.1)
SGOT(AST): 325 U/L — ABNORMAL HIGH (ref 15–37)
SGPT (ALT): 106 U/L — ABNORMAL HIGH
Sodium: 138 mmol/L (ref 136–145)
TOTAL PROTEIN: 6.9 g/dL (ref 6.4–8.2)

## 2014-06-28 LAB — LIPASE, BLOOD: LIPASE: 786 U/L — AB (ref 73–393)

## 2014-06-28 LAB — CBC
HCT: 24.8 % — AB (ref 40.0–52.0)
HGB: 7.4 g/dL — AB (ref 13.0–18.0)
MCH: 23.5 pg — AB (ref 26.0–34.0)
MCHC: 29.9 g/dL — ABNORMAL LOW (ref 32.0–36.0)
MCV: 79 fL — ABNORMAL LOW (ref 80–100)
Platelet: 44 10*3/uL — ABNORMAL LOW (ref 150–440)
RBC: 3.16 10*6/uL — AB (ref 4.40–5.90)
RDW: 21 % — ABNORMAL HIGH (ref 11.5–14.5)
WBC: 3 10*3/uL — AB (ref 3.8–10.6)

## 2014-07-08 ENCOUNTER — Emergency Department: Payer: Self-pay | Admitting: Emergency Medicine

## 2014-07-09 LAB — COMPREHENSIVE METABOLIC PANEL
Albumin: 2.2 g/dL — ABNORMAL LOW (ref 3.4–5.0)
Alkaline Phosphatase: 218 U/L — ABNORMAL HIGH
Anion Gap: 13 (ref 7–16)
Bilirubin,Total: 0.7 mg/dL (ref 0.2–1.0)
CO2: 24 mmol/L (ref 21–32)
CREATININE: 0.43 mg/dL — AB (ref 0.60–1.30)
Calcium, Total: 7.5 mg/dL — ABNORMAL LOW (ref 8.5–10.1)
Chloride: 103 mmol/L (ref 98–107)
Glucose: 94 mg/dL (ref 65–99)
Potassium: 3.2 mmol/L — ABNORMAL LOW (ref 3.5–5.1)
SGOT(AST): 354 U/L — ABNORMAL HIGH (ref 15–37)
SGPT (ALT): 112 U/L — ABNORMAL HIGH
Sodium: 140 mmol/L (ref 136–145)
Total Protein: 6.7 g/dL (ref 6.4–8.2)

## 2014-07-09 LAB — URINALYSIS, COMPLETE
Bacteria: NONE SEEN
Bilirubin,UR: NEGATIVE
GLUCOSE, UR: NEGATIVE mg/dL (ref 0–75)
KETONE: NEGATIVE
Leukocyte Esterase: NEGATIVE
Nitrite: NEGATIVE
PROTEIN: NEGATIVE
Ph: 6 (ref 4.5–8.0)
RBC, UR: NONE SEEN /HPF (ref 0–5)
Specific Gravity: 1.005 (ref 1.003–1.030)

## 2014-07-09 LAB — CBC
HCT: 23.6 % — AB (ref 40.0–52.0)
HGB: 7.1 g/dL — AB (ref 13.0–18.0)
MCH: 24.3 pg — ABNORMAL LOW (ref 26.0–34.0)
MCHC: 29.9 g/dL — AB (ref 32.0–36.0)
MCV: 81 fL (ref 80–100)
PLATELETS: 63 10*3/uL — AB (ref 150–440)
RBC: 2.9 10*6/uL — ABNORMAL LOW (ref 4.40–5.90)
RDW: 21.8 % — AB (ref 11.5–14.5)
WBC: 3.4 10*3/uL — AB (ref 3.8–10.6)

## 2014-07-09 LAB — DRUG SCREEN, URINE
AMPHETAMINES, UR SCREEN: NEGATIVE (ref ?–1000)
BARBITURATES, UR SCREEN: NEGATIVE (ref ?–200)
BENZODIAZEPINE, UR SCRN: NEGATIVE (ref ?–200)
COCAINE METABOLITE, UR ~~LOC~~: NEGATIVE (ref ?–300)
Cannabinoid 50 Ng, Ur ~~LOC~~: NEGATIVE (ref ?–50)
MDMA (ECSTASY) UR SCREEN: NEGATIVE (ref ?–500)
METHADONE, UR SCREEN: NEGATIVE (ref ?–300)
Opiate, Ur Screen: POSITIVE (ref ?–300)
Phencyclidine (PCP) Ur S: NEGATIVE (ref ?–25)
Tricyclic, Ur Screen: NEGATIVE (ref ?–1000)

## 2014-07-09 LAB — ETHANOL
ETHANOL LVL: 363 mg/dL — AB
Ethanol %: 0.363 % (ref 0.000–0.080)

## 2014-07-09 LAB — LIPASE, BLOOD: LIPASE: 621 U/L — AB (ref 73–393)

## 2014-09-06 DEATH — deceased

## 2015-03-26 NOTE — Op Note (Signed)
PATIENT NAME:  Jesse Hughes, Jesse Hughes MR#:  [id number] DATE OF BIRTH:  1961/01/18  DATE OF PROCEDURE:  08/17/2012  PREOPERATIVE DIAGNOSIS: Wound infection following a stomal reciting.  POSTOPERATIVE DIAGNOSIS: Wound infection following a stomal reciting.   PROCEDURE PERFORMED: Operative Wound VAC change of midline wound.   SURGEON: Moselle Rister A. Even Budlong, MD   ESTIMATED BLOOD LOSS: Minimal.   ANESTHESIA: General.   SPECIMENS: None.   COMPLICATIONS: None.   INDICATION FOR SURGERY: 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 This was repaired and due to the fact that there was a large hernia sac and the stoma was lateral to the rectus muscle my thought was that this would recur, therefore, I resited the stoma superiorally. I also repaired a midline hernia at the time which required the creation of large soft tissue flaps. Postoperatively these spaces became infected requiring opening of the incision and operative debridement and washout. He had a Wound VAC placed. I brought him back to the operating room for washout and changing of wound vac.   DETAILS OF PROCEDURE: The patient was brought to the operating room suite. After informed consent was obtained, he was laid supine on the operating room table. He was induced, endotracheal tube was placed, and general anesthesia was administered. His abdomen was then prepped and draped. After I took the Wound VAC off my findings were his wound was clean and granulating nicely. There were some sutures which appeared loose and may have pulled and I am uncertain whether or not it was looking was granulating fascia versus omentum underneath. Therefore, I did not do any debridement of the fibrinous tissue in the middle. I placed Adaptic gauze over the midline where I was afraid that it may be granulated bowel to protect it from the sponge and possible fistula formation. I then washed  out and examimed his soft tissue flaps which were clear of purulence. There was minimal bleeding and nice granulation tissue at the base.. I then replaced the Wound VAC. I placed a circular 3 x 3 piece of gauze in the lateral previous ostomy site as well and tunneled black foam to the midline wound which measured 17 x 7 cm at its longest both dimensions. It was approximately half centimeter deep. I then placed the Wound VAC. There was good seal. 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 The drapes were then taken down. The patient was awakened and brought to the postanesthesia care unit. There were no immediate complications. Needle, sponge, and instrument counts were correct at the end of the procedure. I was present for the entirety of the whole case.   ____________________________ Glena Norfolk. Makayah Pauli, MD cal:drc D: 08/17/2012 10:41:18 ET T: 08/17/2012 10:58:11 ET JOB#: [id number]  cc: Harrell Gave A. Halim Surrette, MD, <Dictator> Floyde Parkins MD ELECTRONICALLY SIGNED 08/17/2012 13:04

## 2015-03-26 NOTE — Op Note (Signed)
PATIENT NAME:  Jesse Hughes, Jesse Hughes MR#:  [id number] DATE OF BIRTH:  08-Apr-1961  DATE OF PROCEDURE:  08/11/2012  PREOPERATIVE DIAGNOSIS: Wound sepsis.   POSTOPERATIVE DIAGNOSIS: Wound sepsis.   PROCEDURE:  1. Examination under anesthesia.  2. Wound VAC placement. 3. Ostomy change.   SURGEON: Phoebe Perch, MD   ANESTHESIA: General with endotracheal tube.   INDICATIONS: This is a patient with a history of a recent parastomal hernia repair who has dehisced a portion of his wound, clearly showing signs of wound sepsis.   Preoperatively, we discussed the rationale for surgery and the need for wound VAC placement to improve outcome. Formal consent was obtained by telephone from a family member.   FINDINGS: Wound sepsis extending beneath flaps that had been developed on either side of the midline incision. The PDS sutures were intact. No fascial dehiscence noted, however, these cavities connected to the old ostomy site as well as the new ostomy site where there was some dehiscence of the ileostomy from the lateral extent of the skin closure of the ostomy site in the right upper quadrant.   DESCRIPTION OF PROCEDURE: The patient was induced to general anesthesia. He was already on IV antibiotics. He was prepped and draped in a sterile fashion after removing his ostomy appliance. Skin staples were removed initially only in the lower portion of the wound and from the dehisced old ostomy site in the right lower quadrant; but it was quickly determined that the cavity extended well cephalad to the opened area, so all the staples were removed. Clot was suctioned free of the left lateral side. It was fairly old in nature. Purulence was noted cephalad and cultures were taken. Irrigation was performed and then a wound VAC was placed as follows.  A large-sized black foam was placed over the fascia in the midline area including the lateral flaps, and a small plug was formed to fit the old ostomy site which  connected to the midline flap dehiscence.   An Charlie Pitter was then placed over the entire abdomen. A circle was cut over the ileostomy in the [place].   The wound VAC drape was then taped across the abdomen. One full size was utilized, and the next full size was cut 2/3 and 1/3 and placed over the cephalad and caudad section of the abdomen in order to obtain proper coverage. This was then cut also around the ileostomy site. An appliance was placed and the suction port was placed over a small cruciate incision in the inferior portion of the foamed area in the midline incision. This was then hooked to suction, and adequate suction and collapse of the foam was obtained. There was only very low level leak, and seemed to be emanating around the ostomy; but after some minimal pressure on the area of the  lateral portion the ostomy, this leak essentially stopped or became very low level. The ostomy appliance was placed, and the patient was taken to the recovery room in stable condition to be admitted for continued care. Sponge, lap and needle counts were correct. Estimated blood loss was nil.   ____________________________ Jerrol Banana. Burt Knack, MD rec:cbb D: 08/11/2012 13:58:03 ET T: 08/11/2012 16:51:34 ET JOB#: [id number]  cc: Jerrol Banana. Burt Knack, MD, <Dictator> Florene Glen MD ELECTRONICALLY SIGNED 08/11/2012 18:18

## 2015-03-26 NOTE — Op Note (Signed)
PATIENT NAME:  Jesse Hughes, Jesse Hughes MR#:  [id number] DATE OF BIRTH:  11-12-1961  DATE OF PROCEDURE:  08/14/2012  PREOPERATIVE DIAGNOSIS: Wound sepsis.   POSTOPERATIVE DIAGNOSIS: Wound sepsis.   PROCEDURE: Wound VAC exchange.   SURGEON: Phoebe Perch, MD   ANESTHESIA: General with LMA.   INDICATIONS: This is a patient with wound sepsis following an ileostomy site relocation and revision and incarcerated parastomal hernia repair by Dr. Rexene Edison. He is here for elective wound VAC change, last changed on Thursday three days ago. We discussed the rationale for wound VAC change and the risks of ongoing sepsis or worsening of his wound condition. He understood and agreed to proceed.   FINDINGS: Less obvious communication between the new ileostomy site and the remainder of the abdominal flap cavity. There was still communication with the old ileostomy site. The lateral side of the ileostomy was granulating but still did not have mucosa to skin adherence. Because of the amount large amount of inflammatory process and granulation tissue in this area, it was elected not to place sutures due to the risk of pulling out and bleeding. The midline fascia was intact. Wound sepsis was minimal.   DESCRIPTION OF PROCEDURE: The patient was induced to general anesthesia. He was on IV antibiotics and VTE prophylaxis was in place. He was prepped in a sterile fashion. The midline fascia was inspected. Rough debridement utilizing laparotomy pads was performed to remove some fibrinopurulent exudate over the PDS sutures, which appeared loose but intact. Further inspection of subdermal flaps was performed, and they extend quite widely to the left and right; and on the right it extends to the old ileostomy site. Without causing a lot of pressure, I inspected whether or not it connected with the new ileostomy site, and it did not seem to connect the way it did on Thursday at the previous wound VAC placement. With that in mind, it  was decided to isolate the ileostomy site separate and place a new wound VAC as below.   A small piece of black sponge was placed into the old ileostomy site cavity, and then a large black foam was tailored to not involve the right upper quadrant ileostomy site and was narrowed slightly more than it had been previously. It was placed into the midline wound and subcutaneous flaps, and then an [place] drape was placed over the entire abdominal wall.   The wound VAC drapes were then trimmed and placed over the [place] drape to encompass the midline wound and the right lower quadrant old ileostomy site. Holes were made and separate suction devices were placed over the old ileostomy site and the midline wound at the caudad portion of the wound. These were connected via a Y-connector, and gentle pressure on the new ileostomy site allowed the suction to continue with a very low leak rate. Pressure was removed from the ileostomy site, and pressure was maintained; then an ostomy appliance was placed, as was a bag, and the suction was maintained.   The patient tolerated this procedure well. There were no complications. He was taken to the recovery room in stable condition to be admitted for continued care.   ____________________________ Jerrol Banana. Burt Knack, MD rec:cbb D: 08/14/2012 09:16:01 ET T: 08/14/2012 12:09:29 ET JOB#: [id number]  cc: Jerrol Banana. Burt Knack, MD, <Dictator> Florene Glen MD ELECTRONICALLY SIGNED 08/14/2012 14:14

## 2015-03-26 NOTE — Discharge Summary (Signed)
PATIENT NAME:  Jesse Hughes, EUCEDA MR#:  [id number] DATE OF BIRTH:  Jul 22, 1961  DATE OF ADMISSION:  08/04/2012 DATE OF DISCHARGE:  08/22/2012  DISCHARGE DIAGNOSES:  1. Small bowel obstruction due to a peristomal hernia, status post exploratory laparotomy, reduction of peristomal hernia, resiting of ostomy, and ventral hernia repair.  2. Postoperative wound infection secondary to resiting of ostomy.  3. Alcoholism and delirium tremens.  4. History of rectal cancer, status post LAR in 2012.  5. Posttraumatic stress disorder.  6. Depression.  7. Gastroesophageal reflux disease.    PROCEDURE PERFORMED:  1. Exploratory laparotomy with extensive lysis of adhesions. 2. Reduction of incarcerated peristomal hernia, resiting of ostomy and ventral hernia repair 08/04/2012.  3. Midline wound infection requiring opening of wound and placement of wound VAC 08/11/2012.  4. Operative wound VAC change 08/14/2012, 08/17/2012.   DISCHARGE MEDICATIONS:  1. Mylanta 80 mg p.o. daily.  2. Atenolol 25 mg p.o. daily.  3. Hydrochlorothiazide 12.5 p.o. daily.  4. Dilantin 300 mg p.o. b.i.d.  5. Percocet 5/355 one to 2 tabs p.o. q.4-6 hours p.r.n. pain.   HOSPITAL COURSE: Mr. Ludlam presented on 08/04/2012 with a bowel obstruction. He had a lactic acidosis. He is also visibly intoxicated. Attempts were made to try to get him back to [facility] where he had had his previous surgeries, however, after talking to the attending physician at there and finding that they could not take him secondary to hospital confinements we brought him in the operating room and noticed that he had an incarcerated and this partially ischemic piece bowel that was reduced.  I resited his ostomy and repaired his midline hernia with large flaps; however, the case was dirty because of the ostomy resiting, and attempts were made to minimize his contamination.  Postoperatively patient was brought to the intensive care unit  and underwent DTs for the next few days. He eventually resolved those but had a persistent leukocytosis and developed purulence from his midline wound, and therefore on 08/11/2012 we opened his midline wound and he was brought back on 08/14/2012 for further debridement and VAC placement. I did change his VAC in the OR and then at bedside. Throughout his hospital course he was initially made n.p.o. and given IV fluids. He then was transitioned to diet as his ostomy was working and was transitioned from IV to p.o. pain medications. At the time of discharge he was tolerating good p.o., good p.o. pain control. He was able to tolerate VAC changes at bedside. At this time we thought he was suitable for discharge to home once we could arrange for a home wound VAC with followup in the wound center.   DISCHARGE INSTRUCTIONS: Mr. Enis is to undergo 3 times weekly VAC changes for his wound. He is also to follow up with me in approximately 1 to 2 weeks. He is to call or return to clinic if he has increased drainage or change in drainage from his midline wound or any change in his ostomy output.    ____________________________ Glena Norfolk. Jamy Cleckler, MD cal:vtd D: 08/30/2012 19:38:30 ET T: 09/01/2012 10:44:24 ET JOB#: [id number]  cc: Harrell Gave A. Henritta Mutz, MD, <Dictator> Floyde Parkins MD ELECTRONICALLY SIGNED 09/01/2012 18:49

## 2015-03-26 NOTE — Op Note (Signed)
PATIENT NAME:  Jesse Hughes, Jesse Hughes MR#:  [id number] DATE OF BIRTH:  Feb 17, 1961  DATE OF PROCEDURE:  08/04/2012  PREOPERATIVE DIAGNOSES:  1. Incarcerated peristomal hernia.  2. Large midline hernia.  POSTOPERATIVE DIAGNOSES:  1. Incarcerated peristomal hernia.  2. Large midline hernia.  PROCEDURES PERFORMED:  1. Exploratory laparotomy.  2. Reduction of peristomal hernia. 3. Resiting/revision of loop ileostomy.  4. Closure of ventral hernia with large subcutaneous flaps.   SURGEON: Marlyce Huge, MD  ESTIMATED BLOOD LOSS: 700 mL.  ANESTHESIA: General.   COMPLICATIONS: None.   SPECIMENS: None.   INDICATION FOR SURGERY: Jesse Hughes is an unfortunate 54 year old male with a history of rectal cancer status post chemoradiation, status post anterior resection with diverting loop ileostomy in 2012. He had a large peristomal hernia with obstruction and inability to be reduced. He also became increasingly tender throughout the day. We then brought him to the operating room for reduction of hernia.   DETAILS OF PROCEDURE:  Informed consent was obtained. He was laid supine on the operating room table. His abdomen was prepped and draped in standard surgical fashion. A time out was then performed correctly identifying the patient's name, operative site, and procedure to be performed. An incision was made above his previous incision. This allowed safe entry into the abdomen. It was deepened down in the peritoneum. We then slowly extended the incision through his previous hernia making sure not to injure any adherent bowel. His bowel was extremely dilated and there was a large amount of abdominal ascites. I then lysed adhesions so I could follow the bowel from the ligament of Treitz to the hernia. I was able to carefully reduce the hernia. There was a small segment of ischemic but not necrotic bowel within the hernia which I reduced. I then focused on closing the abdomen. I made large  subcutaneous flaps as he had a very sizable hernia. Prior to closure, I decided that with the size of his parastomal hernia sac that this would continue to recur. Therefore, I elected to place the ostomy in his right upper quadrant.  Because it was a loop, I elected to take it out as a whole and made an approximately quarter size subcutaneous defect in his right upper quadrant. I made a fascial defect large enough to allow two fingers. I then placed the ostomy through the defect.  When taking down the ostomy I did make a small full thickness injury to the proximal limb proximal to the stoma which i closed easily in two layers using a 3-0 vicryl inner layer and 3-0 silk lembert outer layer.  I then used a running 0 Prolene to close his previous stoma site. I excised as much of the sac as able. I then closed his midline fascia with running #1 looped PDS. I then placed two JP drains under both flaps in the subcutaneous tissue, which were secured to the skin with 3-0 nylon. The midline abdominal wound was then stapled. The previous stoma site was stapled. We then placed dressings on the wounds.  I then matured the stoma using interrupted 3-0 monocryl.  I then placed a stoma appliance on the stoma, which fit well. The patient was then awoken and taken to postanesthesia care unit. There were no immediate complications. Needle, sponge, and instrument counts were correct at the end of the procedure. I was present for the entirety of the case.  ____________________________ Glena Norfolk. Addylin Manke, MD cal:slb D: 08/05/2012 08:19:00 ET T: 08/05/2012 08:44:11 ET JOB#: [id number]  cc:  Maye Parkinson A. Korbin Notaro, MD, <Dictator> Floyde Parkins MD ELECTRONICALLY SIGNED 08/06/2012 22:03

## 2015-03-26 NOTE — H&P (Signed)
    Subjective/Chief Complaint Stomal pain, decreased stomal output    History of Present Illness Mr. Mancia is a pleasant 54 yo M with a history of rectal cancer s/p LAR in 2012 at [facility] who presents with 1 day of peristomal pain.  He says that it has gotten worse and he has not had ostomy output in 12 hours.  He    Past History Rectal ca s/p LAR in 2012 PTSD Depression GERD EtOH abuse    Past Medical Health Cancer, Smoking, Alcohol Consumption/Dependency   Past Med/Surgical Hx:  Depression:   Post Traumatic Stress Disorder:   GERD - Esophageal Reflux:   Suicide Attempt:   Alcohol Abuse:   Back Pain, Chronic:   Seizures:   Denies surgical history.:   ALLERGIES:  Lomax: Unknown  Lithium: Unknown  Zoloft: Unknown  Review of Systems:   Subjective/Chief Complaint Abdominal pain, decreased ostomy output    Fever/Chills No    Cough No    Sputum No    Abdominal Pain Yes    Diarrhea No    Constipation Yes    Nausea/Vomiting No    SOB/DOE No    Chest Pain No    Dysuria No    Tolerating Diet No    Medications/Allergies Reviewed Medications/Allergies reviewed   Physical Exam:   GEN well developed, thin, disheveled    HEENT pink conjunctivae    NECK No masses    RESP normal resp effort    CARD regular rate  no thrills  no carotid bruits    VASCULAR ACCESS No bruit, no thrill  -- Purulent drainage    ABD positive tenderness  positive hernia  rigid  distended    LYMPH negative neck    EXTR negative cyanosis/clubbing, negative edema    SKIN No rashes, No ulcers    PSYCH alert, A+O to time, place, person, good insight     Assessment/Admission Diagnosis Mr. Cuttino is a pleasant 54 yo M with parastomal hernia.  Nonreducible.  Tender.  Lactate 3.4. Pain worsening.  I have spoken with Dr. Hoyt Koch at the [facility] in [place] and he says they cannot take him at this time.    Plan To OR for exploratory laparotomy, reduction of hernia, repair  of fascial defect   Electronic Signatures: Floyde Parkins (MD)  (Signed 29-Aug-13 15:04)  Authored: CHIEF COMPLAINT and HISTORY, PAST MEDICAL/SURGIAL HISTORY, ALLERGIES, REVIEW OF SYSTEMS, PHYSICAL EXAM, ASSESSMENT AND PLAN   Last Updated: 29-Aug-13 15:04 by Floyde Parkins (MD)
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
## 2015-03-29 NOTE — Op Note (Signed)
PATIENT NAME:  Jesse Hughes, Jesse Hughes MR#:  [id number] DATE OF BIRTH:  December 08, 1960  DATE OF PROCEDURE:  11/14/2013  PREOPERATIVE DIAGNOSIS: Small bowel obstruction and ventral hernia.   POSTOPERATIVE DIAGNOSIS: Small bowel obstruction due to internal hernia and ventral hernia.   PROCEDURE PERFORMED: 1.  Exploratory laparotomy.  2.  Extensive lysis of adhesions, greater than 45 minutes.  3.  Reduction of internal hernia.  4.  Ventral hernia repair with absorbable Vicryl mesh.   ESTIMATED BLOOD LOSS: 25 mL.   COMPLICATIONS: None.   SPECIMENS: None.   INDICATION FOR SURGERY: Mr. Phung is a pleasant 54 year old male who presented with small bowel obstruction and a large ventral hernia. It has not improved with NG decompression, and he did have fevers, and there was concern that bowel was compromised.  We thus brought him to the operating room suite for exploratory laparotomy.   DETAILS OF PROCEDURE: As follows: Once consent was obtained, Mr. Noyola was brought to the operating room suite. He was laid supine on the operating room table. He was induced. Endotracheal tube was placed, general anesthesia was administered. A timeout was then performed correctly identifying the patient name, operative site and procedure to be performed. An epigastric incision was made. This was deepened down to the fascia. The fascia was incised. The hernia was encountered and slowly opened. There was no obvious purulence, but some definite distended small loops of bowel, which were consistent with ligament of Treitz.  These were followed up, and there was a large loop of bowel that had tucked under a defect in the mesentery of the transverse colon above the splenic flexure. The bowel was freed up and reduced after the anatomy was elucidated (Dictation Anomaly) significant amount of lysis of adhesions from other areas to ensure that there were no other internal hernias. Once the hernia was reduced and adhesions were  lysed, the mesentery defect was closed with a running 3-0 Vicryl. The fascia was then cleared off and noted to be too widespread to be able to close primarily. As the patient will need an ostomy reversal in the future, I elected to close with Vicryl mesh and elect hernia repair at a later date.  I then used a 3-0 Prolene suture to the fascia circumferentially to a piece of Vicryl mesh. Once I was satisfied with the mesh, the skin was then stapled shut, and a wound VAC apparatus was placed over the incision to decrease the amount of subQ edema. The patient was then awoken, extubated and brought to the postanesthesia care unit. There were no immediate complications. Needle, sponge and instrument counts were correct at the end of the procedure.    ____________________________ Glena Norfolk. Janeya Deyo, MD cal:dmm D: 11/15/2013 11:56:00 ET T: 11/15/2013 12:59:51 ET JOB#: [id number]  cc: Harrell Gave A. Tanish Prien, MD, <Dictator> Floyde Parkins MD ELECTRONICALLY SIGNED 11/16/2013 11:45

## 2015-03-29 NOTE — Consult Note (Signed)
Referring Physician:  Benjie Karvonen, Sital :   Primary Care Physician:  Nonlocal MD, MD :   Reason for Consult: Admit Date: 23-Oct-2013  Chief Complaint: Seizure  Reason for Consult: seizure   History of Present Illness: History of Present Illness:   54 year old man who presented with AMS with witnessed seizure at home as well as head injury.  Patient says he fell and hit his head on a chair and possibly the ground which led to the injuries to his face.  He also had tongue biting.  He was taken to the ED where he had additional witnessed seizure activity.  In particular, patient with GTC activitiy.  Patient reports that he is only taking 100 mg bid of phenytoin.  Says he has not used alcohol recently.  Denies taking any other seizure medication.  He is not sure when his most recent seizure was before this one.  His cognition has improved somewhat since admission though still with confusion.   MEDICAL/SURGICAL HISTORY: History of rectal cancer.  PTSD.  Depression.  Chronic back pain.  History of seizures.  GERD.  Alcohol dependence.  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   Small bowel obstruction due to peristomal hernia, status post ileostomy, status post exploratory laparotomy and reduction of peristomal hernia, resiting of ostomy and ventral hernia repair.   Hypertension.   HISTORY: No history of heart disease.  HISTORY: The patient quit drinking about 6 or 7 months ago. He was drinking 340  ounces a day. No IV drug use or tobacco use.   MEDICATIONS: Home meds, as below. FLOMAX, LITHIUM AND ZOLOFT.      ROS:  General fatigue    HEENT no complaints    Lungs no complaints    Cardiac no complaints    GI no complaints    GU no complaints    Musculoskeletal no complaints    Extremities no complaints    Skin chin laceration    Endocrine no complaints    Past Medical/Surgical Hx:  Renal Cancer:   Depression:   Post Traumatic Stress Disorder:   GERD - Esophageal Reflux:    Suicide Attempt:   Alcohol Abuse:   Back Pain, Chronic:   Seizures:   Ileostomy:   Denies surgical history.:   Home Medications: Medication Instructions Last Modified Date/Time  atenolol 50 mg tablet 0.5 tab(s) orally once a day 17-Nov-14 16:42  Dilantin 100 mg oral capsule, extended release 1 cap(s) orally 2 times a day 17-Nov-14 16:42  ferrous gluconate 324 mg oral tablet 1 tab(s) orally 3 times a day 17-Nov-14 16:42  lacosamide 150 mg oral tablet 1 tab(s) orally 2 times a day 17-Nov-14 16:42  thiamine 100 mg oral tablet 1 tab(s) orally once a day 17-Nov-14 16:42  omeprazole 20 mg oral delayed release capsule 1 cap(s) orally once a day 17-Nov-14 16:42  lactase 3000 units oral tablet 1 tab(s) orally 3 times a day (before meals), As Needed for indigestion 17-Nov-14 16:42  citalopram 40 mg oral tablet 0.5 tab(s) orally once a day 17-Nov-14 16:42   [facility] Current Meds:   Acetaminophen * tablet, ( Tylenol (325 mg) tablet)  650 mg Oral q4h PRN for pain or temp. greater than 100.4  - Indication: Pain/Fever  Ondansetron injection, ( Zofran injection )  4 mg, IV push, q4h PRN for Nausea/Vomiting  Indication: Nausea/ Vomiting  Phenytoin Sodium ER capsule, ( Dilantin)  100 mg Oral q12h  - Indication: Seizures/Status Epilepticus/Neuritic Pain/Venticular Dysrhythmias  Tetanus-diphtheria-pertusis-acel (Tdap) (Adult), 0.5  ml, Intramuscular, atdischarge  cloNIDine  tablet, ( Catapres)  0.1 mg Oral q3h PRN for hypertension, withdrawal symptoms  Stop After: 5 Days  - Indication: Hypertension/ Withdrawal Symptoms  Instructions:  -systolic BP > 758 or diastolic BP > 832 per Alcohol Detox Orders  Folic Acid tablet, ( Folate)  1 mg Oral daily  Stop After: 5 Days  - Indication: Folic Acid Deficiency  Haloperidol tablet, ( Haldol)  2 mg Oral q4h PRN for hallucinosis, paranoia, agitation not calmed by detox medications.  Stop After: 5 Days  - Indication: Psychosis/ Delirium  Instructions:   Don't give for sedation only - watch for dystonic reaction in young person.  May give IM  LORazepam injection, ( Ativan injection )  2 mg, Intramuscular, q1h PRN for anxiety, seizures, antiemetic adjunct, preop sedation.  Stop After:  24 Hours  Indication: Anxiety/ Seizure/ Antiemetic Adjunct/ Preop Sedation, for first 24 hours not to exceed 16 mg/24 hours for Alcohol Detox.  May give PO  LORazepam tablet, ( Ativan)  2 mg Oral q1h PRN for anxiety, seizures, antiemetic adjunct, preop sedation  Stop After: 24 Hours  - Indication: Anxiety/ Seizure/ Antiemetic Adjunct/ Preop Sedation  Instructions:  first 24 hours not to exceed 16 mg/24 hrs for Alcohol Detox.  May give IM  Magnesium Oxide tablet, ( Mag-Ox)  400 mg Oral daily  Stop After: 5 Days  - Indication: Prevention of Magnesium Deficiency  Multivitamin tablet, ( Vitamin - Multiple)  1 tablet(s) Oral daily  Stop After: 5 Days  - Indication: Prevention and Treatment of Vitamin Deficiencies  Promethazine tablet, ( Phenergan)  25 mg Oral q4h PRN for nausea, vomiting  Stop After: 5 Days  - Indication: Antiemetic/ Motion Sickness/ Sedative/ Antihistamine  Instructions:  Nausea.  May give IM  Thiamine injection, 100 mg, Intramuscular, daily.  Stop After:  3 Days  Indication: Thiamine (B1) Deficiency, x 3 days  Thiamine tablet, ( Thiamine HCI)  100 mg Oral daily  Stop After: 3 Days  - Indication: Thiamine (B1) Deficiency  Instructions:  x 3 days.  May give IM  meTOProlol tartrate tablet, ( Lopressor)  25 mg Oral q6h  - Indication: Antihypertensive/ Angina  Nursing Saline Flush, 3 to 6 ml, IV push, Q1M PRN for IV Maintenance  LORazepam injection, ( Ativan injection )  2 mg, Intramuscular, q2h PRN for anxiety, seizures, antiemetic adjunct, preop sedation.  Stop After:  48 Hours, after first 24 hours for next 48 hours not to exceed 16 mg/24 hours for Alcohol Detox.  May give PO  LORazepam tablet, ( Ativan)  2 mg Oral q2h PRN for anxiety,  seizures, antiemetic adjunct, preop sedation  Stop After: 48 Hours  - Indication: Anxiety/ Seizure/ Antiemetic Adjunct/ Preop Sedation  Instructions:  after first 24 hours for next 48 hours not to exceed 16 mg/24 hours for Alcohol Detox.  May give IM  LORazepam injection, ( Ativan injection )  1 mg, Intramuscular, q2h PRN for anxiety, seizures, antiemetic adjunct, preop sedation.  Stop After:  48 Hours, after first 72 hours for next 48 hours not to exceed 16 mg/24 hours for Alcohol Detox.  May give PO  LORazepam tablet, ( Ativan)  1 mg Oral q2h PRN for anxiety, seizures, antiemetic adjunct, preop sedation  Stop After: 48 Hours  - Indication: Anxiety/ Seizure/ Antiemetic Adjunct/ Preop Sedation  Instructions:  after first 72 hours for next 48 hours not to exceed 16 mg/24 hours for Alcohol Detox.  May give IM  Thiamine tablet, (  Thiamine HCI)  50 mg Oral daily  Stop After: 3 Days  - Indication: Thiamine (B1) Deficiency  Instructions:  start after 163m Thiamine given for three days  Allergies:  Lomax: Unknown  Lithium: Unknown  Zoloft: Unknown  Vital Signs: **Vital Signs.:   17-Nov-14 20:23  Vital Signs Type Routine  Temperature Temperature (F) 102.6  Celsius 39.2  Temperature Source oral  Pulse Pulse 137  Respirations Respirations 18  Systolic BP Systolic BP 1161 Diastolic BP (mmHg) Diastolic BP (mmHg) 74  Mean BP 87  Pulse Ox % Pulse Ox % 98  Pulse Ox Activity Level  At rest  Oxygen Delivery Room Air/ 21 %   EXAM: GENERAL: Pleasant.  No distress.  Normocephalic and atraumatic.  EYES: Funduscopic exam shows normal disc size, appearance and C/D ratio without clear evidence of papilledema.  CARDIOVASCULAR: S1 and S2 sounds are within normal limits, without murmurs, gallops, or rubs.  MUSCULOSKELETAL: Bulk - Normal Tone - Normal Pronator Drift - Absent bilaterally. Ambulation - Deferrd due to falls/seizure precautions.  R/L 5/5    Shoulder abduction  (deltoid/supraspinatus, axillary/suprascapular n, C5) 5/5    Elbow flexion (biceps brachii, musculoskeletal n, C5-6) 5/5    Elbow extension (triceps, radial n, C7) 5/5    Finger adduction (interossei, ulnar n, T1)   5/5    Hip flexion (iliopsoas, L1/L2) 5/5    Knee flexion (hamstrings, sciatic n, L5/S1) 5/5    Knee extension (quadriceps, femoral n, L3/4) 5/5    Ankle dorsiflexion (tibialis anterior, deep fibular n, L4/5) 5/5    Ankle plantarflexion (gastroc, tibial n, S1)  NEUROLOGICAL: MENTAL STATUS: Patient is oriented x 2.  Recent memory mildly diminished.  Remote memory is intact.  Attention span and concentration are intact.  Naming, repetition, comprehension and expressive speech are within normal limits.  Patient's fund of knowledge is within normal limits for educational level.  CRANIAL NERVES: Normal    CN II (normal visual acuity and visual fields) Normal    CN III, IV, VI (extraocular muscles are intact) Normal    CN V (facial sensation is intact bilaterally) Normal    CN VII (facial strength is intact bilaterally) Normal    CN VIII (hearing is intact bilaterally) Normal    CN IX/X (palate elevates midline, normal phonation) Normal    CN XI (shoulder shrug strength is normal and symmetric) Normal    CN XII (tongue protrudes midline)   SENSATION: Intact to pain and temp bilaterally (spinothalamic tracts) Intact to position and vibration bilaterally (dorsal columns)   REFLEXES: R/L 2+/2+    Biceps 2+/2+    Brachioradialis   2+/2+    Patellar 2+/2+    Achilles   COORDINATION/CEREBELLAR: Finger to nose testing is within normal limits..  Lab Results:  Thyroid:  17-Nov-14 11:39   Thyroid Stimulating Hormone 3.12 (0.45-4.50 (International Unit)  ----------------------- Pregnant patients have  different reference  ranges for TSH:  - - - - - - - - - -  Pregnant, first trimetser:  0.36 - 2.50 uIU/mL)  Hepatic:  17-Nov-14 11:39   Bilirubin, Total  1.1  Alkaline  Phosphatase 107  SGPT (ALT)  94  SGOT (AST)  168  Total Protein, Serum  8.6  Albumin, Serum 3.8  TDMs:  18-Nov-14 04:26   Dilantin, Serum 14.6 (Result(s) reported on 24 Oct 2013 at 05:37AM.)  Routine Chem:  17-Nov-14 11:39   Result Comment PLATELET COUNT - RESULTS VERIFIED BY REPEAT TESTING.  - VERIFIED BY SMEAR ESTIMATE  Result(s) reported on 23 Oct 2013 at 12:13PM.    15:08   Ethanol, S. < 3  Ethanol % (comp) < 0.003 (Result(s) reported on 23 Oct 2013 at 04:29PM.)  18-Nov-14 04:26   Glucose, Serum 88  BUN 10  Creatinine (comp) 0.66  Sodium, Serum 136  Potassium, Serum 3.7  Chloride, Serum 99  CO2, Serum 26  Calcium (Total), Serum 8.6  Anion Gap 11  Osmolality (calc) 270  eGFR (African American) >60  eGFR (Non-African American) >60 (eGFR values <42m/min/1.73 m2 may be an indication of chronic kidney disease (CKD). Calculated eGFR is useful in patients with stable renal function. The eGFR calculation will not be reliable in acutely ill patients when serum creatinine is changing rapidly. It is not useful in  patients on dialysis. The eGFR calculation may not be applicable to patients at the low and high extremes of body sizes, pregnant women, and vegetarians.)  Urine Drugs:  [id number]  Tricyclic Antidepressant, Ur Qual (comp) NEGATIVE (Result(s) reported on 23 Oct 2013 at 04:44PM.)  Amphetamines, Urine Qual. NEGATIVE  MDMA, Urine Qual. NEGATIVE  Cocaine Metabolite, Urine Qual. NEGATIVE  Opiate, Urine qual NEGATIVE  Phencyclidine, Urine Qual. NEGATIVE  Cannabinoid, Urine Qual. NEGATIVE  Barbiturates, Urine Qual. NEGATIVE  Benzodiazepine, Urine Qual. NEGATIVE (----------------- The URINE DRUG SCREEN provides only a preliminary, unconfirmed analytical test result and should not be used for non-medical  purposes.  Clinical consideration and professional judgment should be  applied to any positive drug screen result due to possible interfering substances.  A more  specific alternate chemical method must be used in order to obtain a confirmed analytical result.  Gas chromatography/mass spectrometry (GC/MS) is the preferred confirmatory method.)  Methadone, Urine Qual. NEGATIVE  Cardiac:  18-Nov-14 04:26   CK, Total  1507 (Result(s) reported on 24 Oct 2013 at 05:56AM.)  Routine UA:  17-Nov-14 11:39   Color (UA) Yellow  Clarity (UA) Clear  Glucose (UA) Negative  Bilirubin (UA) Negative  Ketones (UA) 1+  Specific Gravity (UA) 1.012  Blood (UA) 3+  pH (UA) 5.0  Protein (UA) 100 mg/dL  Nitrite (UA) Negative  Leukocyte Esterase (UA) Negative (Result(s) reported on 23 Oct 2013 at 12:19PM.)  RBC (UA) 81 /HPF  WBC (UA) 1 /HPF  Bacteria (UA) TRACE  Epithelial Cells (UA) <1 /HPF  Mucous (UA) PRESENT  Hyaline Cast (UA) 8 /LPF (Result(s) reported on 23 Oct 2013 at 12:19PM.)  Routine Coag:  17-Nov-14 11:39   Prothrombin 13.6  INR 1.0 (INR reference interval applies to patients on anticoagulant therapy. A single INR therapeutic range for coumarins is not optimal for all indications; however, the suggested range for most indications is 2.0 - 3.0. Exceptions to the INR Reference Range may include: Prosthetic heart valves, acute myocardial infarction, prevention of myocardial infarction, and combinations of aspirin and anticoagulant. The need for a higher or lower target INR must be assessed individually. Reference: The Pharmacology and Management of the Vitamin K  antagonists: the seventh ACCP Conference on Antithrombotic and Thrombolytic Therapy. CEUMPN.3614Sept:126 (3suppl): [id number] A HCT value >55% may artifactually increase the PT.  In one study,  the increase was an average of 25%. Reference:  "Effect on Routine and Special Coagulation Testing Values of Citrate Anticoagulant Adjustment in Patients with High HCT Values." American Journal of Clinical Pathology 2006;126:400-405.)  Routine Hem:  17-Nov-14 11:39   Platelet Count (CBC)  42   18-Nov-14 04:26   WBC (CBC)  12.2  RBC (CBC)  3.51  Hemoglobin (  CBC)  8.1  Hematocrit (CBC)  25.6  MCV  73  MCH  23.1  MCHC  31.6  RDW  18.4  Neutrophil % 62.3  Lymphocyte % 30.9  Monocyte % 6.5  Eosinophil % 0.0  Basophil % 0.3  Neutrophil #  7.6  Lymphocyte #  3.8  Monocyte # 0.8  Eosinophil # 0.0  Basophil # 0.0 (Result(s) reported on 24 Oct 2013 at [facility].)   Radiology Results: CT:    17-Nov-14 13:26, CT Head Without Contrast  CT Head Without Contrast   REASON FOR EXAM:    altered mental status  COMMENTS:       PROCEDURE: CT  - CT HEAD WITHOUT CONTRAST  - Oct 23 2013  1:26PM     CLINICAL DATA:  Fall.    EXAM:  CT HEAD WITHOUT CONTRAST    TECHNIQUE:  Contiguous axial images were obtained from the base of the skull  through the vertex without intravenous contrast.    COMPARISON:  04/27/2013 .  FINDINGS:  No mass. No hydrocephalus. No hemorrhage. Diffuse atrophy. No acute  bony abnormality. Mucosal thickening noted in ethmoid sinuses.  Orbits are normal were visualized. Mastoids are clear. Soft tissue  density noted right external auditory canal, most likely cerumen. No  acute bony abnormality . Exam stable from prior study.     IMPRESSION:  FALL.:  IMPRESSION:  FALL.  Diffuse atrophy. Stable CT from prior study of 04/27/2013. No acute  abnormality.      Electronically Signed    By: Marcello Moores  Register    On: 10/23/2013 13:32         Verified By: Osa Craver, M.D., MD   Impression/Recommendations: Recommendations:   54 year old man with witnesed seizure. noted to have normal dilantin level so uncertain if patient is actually taking dilantin 100 mg bid or not.  Would rec obtaining his records or contacting his pharmacy todetermine what and how often he has been filling seizure meds.  Also, if he may be on other seizure meds?  I have noted that the list obtained supports he is taking vimpat 150 mg bid so would continue this medication as well if  this is confirmed by the [place]. is a poor historian but feels confident that he had a seizure leading to his facial injuries and hospitalization.   back to normal cognitively on the 17th but still a little weak likely representing post ictal state.  No focal deficits on exam.   inpatient routine EEG to try to better characterize potential seizure disorder.  Neuroimaging is unremarkable.  Incidentally noted to have decreased Hgb and increased blood in urine. have reviewed the results of the most recent imaging studies and labs as outlined above and answered all related questions.  and coordinated plan of care with hospitalist.    Electronic Signatures: Anabel Bene (MD)  (Signed [phone] 02:42)  Authored: REFERRING PHYSICIAN, Primary Care Physician, Consult, History of Present Illness, Review of Systems, PAST MEDICAL/SURGICAL HISTORY, HOME MEDICATIONS, Current Medications, ALLERGIES, NURSING VITAL SIGNS, Physical Exam-, LAB RESULTS, RADIOLOGY RESULTS, Recommendations   Last Updated: 19-Nov-14 02:42 by Anabel Bene (MD)

## 2015-03-29 NOTE — Discharge Summary (Signed)
PATIENT NAME:  Jesse Hughes, Jesse Hughes MR#:  [id number] DATE OF BIRTH:  August 16, 1961  DATE OF ADMISSION:  10/24/2013 DATE OF DISCHARGE:  10/25/2013   DISCHARGE DIAGNOSES:  1.  Seizure, on Dilantin, now stopped.  2.  Sinus tachycardia, likely from dehydration.  3.  Lacerations, status post seizure, sutured, which can be removed in 7 days.  4.  Thrombocytopenia, likely secondary to alcohol dependence, now stable.  5.  Anemia of chronic disease, stable hemoglobin.  6.  Rhabdomyolysis, could be from seizure and/or dehydration.   SECONDARY DIAGNOSES:  1.  Rectal cancer.  2.  Post traumatic stress disorder.  3.  Depression.  4.  Chronic back pain.  5.  History of seizure.  6.  Gastroesophageal reflux disease.  7.  Alcohol dependence.  8.  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  9.  History of small bowel obstruction.  10.  Hypertension.   CONSULTATIONS:  Neurology, Dr. Gurney Maxin.   PROCEDURES AND RADIOLOGY:  1.  EEG per Dr. Melrose Nakayama on the 18th of November showed normal findings.  2.  CT scan of the head without contrast on the 17th of November showed diffuse atrophy. No acute abnormality.  3.  CT scan of cervical spine without contrast on November 17 showed no acute abnormality. No evidence of fracture.  4.  CT scan of the maxillofacial area without contrast on November 17 showed no acute abnormality.   MAJOR LABORATORY PANELS:  1.  Urinalysis on admission was negative.  2.  Serum Keppra level was pending.   HISTORY AND SHORT HOSPITAL COURSE: The patient is a 54 year old male with the above-mentioned medical problems who was admitted for a seizure. He was loaded with IV fosphenytoin and subsequently started on Dilantin. His Dilantin level was really low and was subsequently started on oral Dilantin. Please see Dr. Ulice Bold Mody's dictated history and physical for further details. Urology consultation was obtained with Dr. Gurney Maxin who recommended EEG, which was performed on the  18th of November which had a normal finding. The patient was still quite agitated until yesterday and pulled out several IVs and was unable to get intravenous fluids or medications. The patient remained persistently tachycardic which was thought to be due to dehydration and possible alcohol withdrawal for which he was started on CIWA protocol. This morning, the patient seemed much calmer and alert along with oriented, but his CK is getting worse. Finally, he was agreeable to get an IV line, which has been established, and IV fluids have been started.   At this point, the patient has been accepted at [facility] by Dr. Durene Fruits and we are waiting for bed availability. As soon as the bed  becomes available, the patient should be able to be transferred/discharged to [facility].   PERTINENT PHYSICAL EXAMINATION ON THE DATE OF DISCHARGE:  VITAL SIGNS: Temperature 98.2, heart rate 110 per minute, respirations 20 per minute, blood pressure 108/72 mmHg, was saturating 97% on room air.  CARDIOVASCULAR: S1, S2 normal. No murmurs, rubs or gallop.  LUNGS: Clear to auscultation bilaterally. No wheezes, rales, rhonchi or crepitation.  ABDOMEN: Soft, benign.  NEUROLOGIC: Nonfocal examination.   All other physical examination remained at baseline.   DISCHARGE MEDICATIONS:  Atenolol 50 mg one half tablet p.o. daily.  Dilantin 100 mg p.o. b.i.d.  Ferrous gluconate 325 mg p.o. 3 times a day.  Lacosamide 150 mg p.o. b.i.d.  Thiamine 100 mg p.o. daily.  Omeprazole 20 mg p.o. daily.  Lipase 3000 units  one tablet p.o. 3 times a day before meals as needed.  Citalopram 20 mg p.o. daily.   DISCHARGE DIET: Regular.   DISCHARGE ACTIVITY: As tolerated. Discharge instructions and follow-up the patient was instructed to follow up with his primary care physician at [facility] in 1 to 2 weeks. He will be transferred to [facility] possibly tonight if they do have a bed  available. The patient's accepting physician is Dr. Durene Fruits. The patient remains at very high risk for infections in fact the patient did have some fever spikes here in the hospital, but no obvious source of infection have been she remains at high risk for aspiration risk urinary tract infection. Physician at [facility].   TOTAL TIME DISCHARGING THIS PATIENT: 55 minutes    ____________________________ Quinnlyn Hearns S. Manuella Ghazi, MD vss:np D: 10/25/2013 16:21:01 ET T: 10/25/2013 17:04:08 ET JOB#: [id number]  cc: Chalyn Amescua S. Manuella Ghazi, MD, <Dictator> Lucina Mellow [facility] MD ELECTRONICALLY SIGNED 10/27/2013 16:33

## 2015-03-29 NOTE — H&P (Signed)
Subjective/Chief Complaint abd pain   History of Present Illness 2 days abd pain, nausea, emesis yesterday no f/c recent dc from [facility] and [place] for detox/etoh abuse   Past History ETOH, detox rectal cancer, siezure disorder, PTSD   Past Med/Surgical Hx:  Renal Cancer:   Depression:   Post Traumatic Stress Disorder:   GERD - Esophageal Reflux:   Suicide Attempt:   Alcohol Abuse:   Back Pain, Chronic:   Seizures:   Ileostomy:   Denies surgical history.:   ALLERGIES:  Lomax: Unknown  Lithium: Unknown  Zoloft: Unknown  HOME MEDICATIONS: Medication Instructions Status  atenolol 50 mg tablet 0.5 tab(s) orally once a day Active  ferrous gluconate 324 mg oral tablet 1 tab(s) orally 3 times a day Active  lacosamide 150 mg oral tablet 1 tab(s) orally 2 times a day Active  thiamine 100 mg oral tablet 1 tab(s) orally once a day Active  omeprazole 20 mg oral delayed release capsule 1 cap(s) orally once a day Active  lactase 3000 units oral tablet 1 tab(s) orally 3 times a day (before meals), As Needed for indigestion Active  citalopram 40 mg oral tablet 0.5 tab(s) orally once a day Active  lamoTRIgine 25 mg oral tablet, dispersible 1 tab(s) orally at bedtime for 1 week then 1 tab two times a day for 1 week then 2 tab two times a day for 2 weeks  then 4 tab two times a day Active   Family and Social History:  Family History Non-Contributory   Social History negative tobacco, positive ETOH, minimal intake this week after DC from [facility] for ETOH   Place of Living Home   Review of Systems:  Fever/Chills No   Cough No   Abdominal Pain Yes   Diarrhea No   Constipation No   Nausea/Vomiting Yes   SOB/DOE No   Chest Pain No   Dysuria No   Tolerating Diet No  Nauseated   Physical Exam:  GEN no acute distress, thin   HEENT pink conjunctivae   NECK supple   RESP normal resp effort  clear BS   CARD regular rate   ABD positive tenderness  distended  normal BS   complex abd wound,healed, distended tympanitic, min tenderness no peritoneal signs  ileostomy fxnl   LYMPH negative neck   EXTR negative edema   SKIN normal to palpation   PSYCH alert, A+O to time, place, person, good insight   Lab Results: Hepatic:  07-Dec-14 01:56   Bilirubin, Total 0.3  Alkaline Phosphatase 98 (45-117 NOTE: New Reference Range 10/27/13)  SGPT (ALT) 33  SGOT (AST)  43  Total Protein, Serum 7.8  Albumin, Serum  2.9  Routine Chem:  07-Dec-14 01:56   Ethanol, S. 154  Ethanol % (comp)  0.154 (Result(s) reported on 12 Nov 2013 at 02:48AM.)  Glucose, Serum  101  BUN  2  Creatinine (comp)  0.46  Sodium, Serum 140  Potassium, Serum 4.0  Chloride, Serum 107  CO2, Serum 25  Calcium (Total), Serum 8.7  Osmolality (calc) 276  eGFR (African American) >60  eGFR (Non-African American) >60 (eGFR values <78mL/min/1.73 m2 may be an indication of chronic kidney disease (CKD). Calculated eGFR is useful in patients with stable renal function. The eGFR calculation will not be reliable in acutely ill patients when serum creatinine is changing rapidly. It is not useful in  patients on dialysis. The eGFR calculation may not be applicable to patients at the low and high extremes of  body sizes, pregnant women, and vegetarians.)  Anion Gap 8  Lipase 196 (Result(s) reported on 12 Nov 2013 at 02:15AM.)  Routine UA:  07-Dec-14 04:55   Color (UA) Yellow  Clarity (UA) Hazy  Glucose (UA) Negative  Bilirubin (UA) Negative  Ketones (UA) Negative  Specific Gravity (UA) 1.010  Blood (UA) Negative  pH (UA) 5.0  Protein (UA) Negative  Nitrite (UA) Negative  Leukocyte Esterase (UA) Negative (Result(s) reported on 12 Nov 2013 at 05:39AM.)  RBC (UA) 1 /HPF  WBC (UA) 1 /HPF  Bacteria (UA) NONE SEEN  Epithelial Cells (UA) <1 /HPF  Mucous (UA) PRESENT  Hyaline Cast (UA) 10 /LPF (Result(s) reported on 12 Nov 2013 at 05:39AM.)  Routine Hem:  07-Dec-14 01:56   WBC (CBC) 9.1  RBC  (CBC)  3.79  Hemoglobin (CBC)  9.3  Hematocrit (CBC)  29.3  Platelet Count (CBC) 386 (Result(s) reported on 12 Nov 2013 at 02:10AM.)  MCV  77  MCH  24.6  MCHC  31.9  RDW  19.9   Radiology Results: CT:    07-Dec-14 05:08, CT Abdomen and Pelvis With Contrast  CT Abdomen and Pelvis With Contrast  REASON FOR EXAM:    (1) DIFFUSE PAIN; (2) H/O OSTOMY;    NOTE: Nursing to   Give Oral CT Contrast  COMMENTS:   May transport without cardiac monitor    PROCEDURE: CT  - CT ABDOMEN / PELVIS  W  - Nov 12 2013  5:08AM     CLINICAL DATA:  Abdominal pain distention, history of rectal cancer  with the ileostomy and hernia repair.    EXAM:  CT ABDOMEN AND PELVIS WITH CONTRAST    TECHNIQUE:  Multidetector CT imaging of the abdomen and pelvis was performed  using the standard protocol following bolus administration of  intravenous contrast.    CONTRAST:  100 cc of Isovue 300    COMPARISON:  CT of the abdomen and pelvis August 04, 2012. Abdominal  radiograph August 08, 2012    FINDINGS:  Similar are left lower lobe atelectasis versus scarring. The  included heart and pericardium are nonsuspicious.    Mild motion degraded examination. Proximal small bowel obstruction,  measuring up to 6.6 cm, with at least 2 transition points in left  lower quadrant, associated with apparent internal hernia, for  example axial 54/97. No pneumatosis or bowel perforation. Misty  mesenteric fat stranding is likely reactive.    Right upper quadrant ileostomy, with wide neck hernia, improved from  prior examination. Trace perisplenic free fluid. No intraperitoneal  free air.    Mild fatty liver, the liver is otherwise unremarkable. The  gallbladder, spleen and adrenal glands are unremarkable. Fullness of  the pancreatic head without discrete mass, unchanged.    Kidneys are unremarkable. Great vessels are normalin course and  caliber with mild calcific atherosclerosis. Eccentric urinary  bladder wall  thickening up to 6 mm, predominately on the right, for  example 75/97. Prostate is not enlarged.  Anterior abdominal wall scarring. Atrophic bilateral psoas muscles.  Mild degenerative change of the spine.     IMPRESSION:  High-grade proximal small bowel obstruction, with suspected  associated internal hernia. No bowel perforation. Trace ascites.  Misty mesentery may be reactive.    Right ileostomy, with predominately reduced peristomal hernia.    Fullness of the pancreatic head without discrete mass.    Mild eccentric bladder wall thickening could reflect cystitis,  consider correlation with urinary analysis versus cystoscopy.  Critical Value/emergent results  were called by telephone at the time  of interpretation on 11/12/2013 at 5:41 AM to [place] , who  verbally acknowledged these results.      Electronically Signed    By: Elon Alas    On: 11/12/2013 05:45         Verified By: Ricky Ala, M.D.,    Assessment/Admission Diagnosis SBO; place NG reexamine, hydrate recent DC for ETOH abuse, states minimal ETOH this week but admission ETOH was 0.15   Electronic Signatures: Florene Glen (MD)  (Signed 07-Dec-14 11:00)  Authored: CHIEF COMPLAINT and HISTORY, PAST MEDICAL/SURGIAL HISTORY, ALLERGIES, HOME MEDICATIONS, FAMILY AND SOCIAL HISTORY, REVIEW OF SYSTEMS, PHYSICAL EXAM, LABS, Radiology, ASSESSMENT AND PLAN   Last Updated: 07-Dec-14 11:00 by Florene Glen (MD)

## 2015-03-29 NOTE — Discharge Summary (Signed)
PATIENT NAME:  Jesse Hughes, Jesse Hughes MR#:  [id number] DATE OF BIRTH:  [birth year]  DATE OF ADMISSION:  10/24/2013 DATE OF DISCHARGE:  10/26/2013  ADDENDUM TO PREVIOUSLY DICTATED DISCHARGE SUMMARY ON 10/25/2013  HISTORY: The patient was waiting for [place] bed at [facility] and his bed was available on 20th of November and was transferred there on evening of 20th of November. Please refer to progress note on 20th of November for details.  On the date of discharge, his vital signs were as follows: Temperature 97.2, heart rate 82 per minute, respirations 18 per minute, blood pressure 119/68 mmHg, and he was saturating 97% on room air.   PERTINENT PHYSICAL EXAMINATION ON THE DATE OF DISCHARGE:  CARDIOVASCULAR: S1 and S2 normal. No murmurs, rubs, or gallop.  LUNGS: Clear to auscultation bilaterally. No wheezing, rales, rhonchi, or crepitation.  ABDOMEN: Soft, benign.  NEUROLOGIC: Nonfocal examination.   All other physical examination remained at the baseline.   Please note, the patient had a seizure on the morning of 20th of November and Neurology had reevaluated the patient. The patient was started on Vimpat along with Keppra per Neurology recommendation. The patient's seizures had stopped and he was slowly recovering and was stable enough to be transferred to [place] THIS PATIENT: 25 minutes.  ____________________________ Lucina Mellow. Manuella Ghazi, MD vss:sb D: 10/30/2013 09:21:55 ET T: 10/30/2013 09:36:56 ET JOB#: [id number]  cc: Liliya Fullenwider S. Manuella Ghazi, MD, <Dictator> [place] MD ELECTRONICALLY SIGNED 10/30/2013 18:56

## 2015-03-29 NOTE — H&P (Signed)
PATIENT NAME:  Jesse Hughes, Jesse Hughes MR#:  [id number] DATE OF BIRTH:  [birth year]  DATE OF ADMISSION:  10/23/2013  [facility]   CHIEF COMPLAINT: Seizure.   HISTORY OF PRESENT ILLNESS:  A 54 year old male who is brought in via ambulance for altered mental status. The patient says that he had a seizure at home. He knows this because he bit his tongue. He also says he hit head while he had a seizure on the chair because he knocked the chair off. Ambulance was called. He lives at some kind of boarding home facility. In the ER, he also had a tonic-clonic seizure that was witnessed. The patient says that he is taking Dilantin 100 mg twice a day and has been compliant with it. He has a large laceration under his chin which was sutured by the ER PA and he had some arterial bleeding.   REVIEW OF SYSTEMS: CONSTITUTIONAL: No fever. Positive fatigue, weakness.  EYES: No blurred or double vision.   ENT: No ear pain, hearing loss, seasonal allergies, postnasal drip.  RESPIRATORY: No cough, wheezing, hemoptysis, dyspnea.  CARDIOVASCULAR: No chest pain, palpitations, edema, orthopnea, syncope.  GASTROINTESTINAL:  No nausea, vomiting, diarrhea abdominal pain, melena or ulcers.  GENITOURINARY:  No dysuria or hematuria. ENDOCRINE: No polyuria or polydipsia.   HEMATOLOGIC AND LYMPHATIC: Positive anemia of chronic disease, positive easy bruising.  SKIN:  He has got a large laceration under his chin with some mild oozing.  MUSCULOSKELETAL: Limited activity. No pain in the neck or the shoulders.  NEUROLOGIC: No history of CVA or TIA. Positive history of seizures.  PSYCHIATRY:  Positive history of depression and PTSD.   PAST MEDICAL HISTORY: 1.  History of rectal cancer.  2.  PTSD.  3.  Depression.  4.  Chronic back pain.  5.  History of seizures.  6.  GERD.  7.  Alcohol dependence.  8.  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   9.  Small bowel obstruction due to peristomal hernia,  status post ileostomy, status post exploratory laparotomy and reduction of peristomal hernia, resiting of ostomy and ventral hernia repair.   10.  Hypertension.    ALLERGIES: FLOMAX, LITHIUM AND ZOLOFT.   FAMILY HISTORY: No history of heart disease.   SOCIAL HISTORY: The patient quit drinking about 6 or 7 months ago. He was drinking 340  ounces a day. No IV drug use or tobacco use.   MEDICATIONS: We do not have a complete list of medications. He does say that he was taking Dilantin 100 mg twice a day and he thinks he is taking atenolol.   PHYSICAL EXAMINATION: VITAL SIGNS: Temperature 98.7, pulse is 114 to 144, respirations 20, blood pressure 125/84, 99% on  room air.  GENERAL: The patient is alert, oriented, does not appear to be in acute distress.  HEENT: Head is atraumatic. Pupils are round and reactive. They are about 3 mm bilaterally. Oropharynx, his lip is really swollen as it is hard to actually inspect his oropharynx because his lip is so swollen. He has difficulty opening his mouth fully.  NECK:  He has got a large laceration on the left lower chin with a pressure dressing on it. No enlarged thyroid.  CARDIOVASCULAR: Tachycardia without murmur, gallop or rubs.  LUNGS:  Clear to auscultation without rhonchi, rales or wheezing.  Normal to percussion.  ABDOMEN: Bowel sounds are positive. He has got an ileostomy bag. He has got multiple scars on his abdomen.  No guarding or distention. No rebound.  No ecchymosis.  BACK: No CVA or vertebral tenderness.  EXTREMITIES: No clubbing, cyanosis or edema. On his right foot, his toes are overlapping as well as on his left foot.   NEURO:  Cranial nerves II through XII are intact. There are no focal deficits. There is no tremor noted. Finger-to-nose intact. Strength:  His lower extremities have 4/5 strength.   LABORATORY AND DIAGNOSTIC DATA: 1.  Total protein 8.6, albumin 3.8, bilirubin 1.1, alk phos 107, AST 168, ALT 94. Sodium 137, potassium 4.0,  chloride 100, bicarb 24, BUN 7, creatinine 0.88, glucose is 117. White blood cells 10.7, hemoglobin 9.6, hematocrit 32, platelets of 42. INR is 1.0. Urinalysis shows no LCE or nitrites.  2.  EKG is sinus tachycardia without ST elevations or depressions.   ASSESSMENT AND PLAN: A 54 year old male with history of seizure disorder who presents with altered mental status with seizure, also laceration secondary to seizure.   1.  Seizure. The patient says he is taking Dilantin 100 mg b.i.d. We are trying to obtain his medication list from the [place].  I asked the PA in the ER to load him with fosphenytoin. I will order a Dilantin level and question if he is Keppra or not, the patient does not state. The patient says he is not on Keppra but I will check a Keppra level as well. We will place the patient on seizure precautions. I have ordered a neurology consultation and EEG further. Workup pending neurology consultation.  2.  Tachycardia. This could possibly be related to the fact that he did not take his beta blocker or he could just be dehydrated. I will restart his beta blocker as I think he has been on this in the past.  I will also provide IV fluids. The patient denies any kind of alcohol drinking within the last 6 months so I do not think this is detoxification from alcohol withdrawal but I ordered an alcohol level. I will also order a TSH and a urine drug screen.  3.  Laceration after seizure. The patient now has a suture. He needs these sutures removed in 7 days. He will need to follow up in the ER.  4.  History of hypertension. We will continue atenolol 25 mg as it seems the patient was on this a year ago when he was here. We will also have his medication list obtained from the [place].  5.  Thrombocytopenia. Likely secondary to alcohol dependence. It appears that in the past year the patient has had low platelets, however, in September of 2013 his platelets were normal but in November, when he was here in 2013, he  had low platelets. No Lovenox due to this and will continue to monitor.  6.  Anemia of chronic disease. His hemoglobin remained stable.  7.  The patient is a full CODE STATUS.  TIME SPENT: Approximately 55 minutes.   ____________________________ Donell Beers. Benjie Karvonen, MD spm:cs D: 10/23/2013 14:57:40 ET T: 10/23/2013 15:20:13 ET JOB#: [id number]  cc: Katalina Magri P. Benjie Karvonen, MD, <Dictator> [facility] Aalyssa Elderkin P Ryszard Socarras MD ELECTRONICALLY SIGNED 10/23/2013 16:06

## 2015-03-29 NOTE — Consult Note (Signed)
Brief Consult Note: Diagnosis: Small bowel obstruction, medical management.   Patient was seen by consultant.   Consult note dictated.   Orders entered.   Comments: Will continue managing medical issues. thanks.  Electronic Signatures: Vaughan Basta (MD)  (Signed 07-Dec-14 11:16)  Authored: Brief Consult Note   Last Updated: 07-Dec-14 11:16 by Vaughan Basta (MD)

## 2015-03-29 NOTE — Discharge Summary (Signed)
PATIENT NAME:  Jesse Hughes, Jesse Hughes MR#:  [id number] DATE OF BIRTH:  06/12/1961  DATE OF ADMISSION:  11/12/2013 DATE OF DISCHARGE:  11/18/2013  DISCHARGE DIAGNOSES: 1.  Internal hernia through transverse colon mesenteric defect, status post exploratory laparotomy, extensive lysis of adhesions, reduction of internal hernia, closure of transverse colon mesentery defect and ventral hernia repair with absorbable Vicryl mesh.  2.  History of rectal cancer, status post low anterior resection with ileostomy.  3.  History of peristomal hernia with obstruction and ileostomy location.  4.  History of ventral hernia.  5.  History of posttraumatic stress disorder.   6.  History of depression.  7.  History of severe alcohol abuse.   MEDICATIONS: At the time of discharge are as follows:  1.  Celexa 20 mg p.o. daily.  2.  Subcutaneous heparin 5000 q. 8. 3.  Multivitamin.  4.  Phenergan 25 rectal q.8 hours p.r.n. nausea. 5.  Tylenol 650 per rectum q.4 hours. p.r.n. pain.  6.  Lacosamide 150 mg q.12 hours.  7.  Norco 1 to 2 tabs p.o. q.4 hours p.r.n. pain.  8.  Clonidine 0.1 p.o. q. 3 hours p.r.n. hypertension and withdrawal.  9.  Folate 1 mg p.o. daily. 10.  Haldol 2 mg intramuscular q.4 hours p.r.n. hallucinations.  11.  Keppra 600 mg p.o. q.12. 12.  Ativan 2 mg intramuscular q. 1 hour p.r.n. anxiety.  13.  Magnesium oxide 400 mg p.o. daily.  14.  Protonix 40 mg p.o. b.i.d.  15.  Thiamine 100 mg p.o. daily x 3 days.   ALLERGIES: FLOMAX, LITHIUM AND ZOLOFT.   PROCEDURE PERFORMED:  As above.  INDICATION FOR ADMISSION: Mr. Jesse Hughes is a pleasant 54 year old male with history of rectal cancer, status post low anterior resection and diverting loop ileostomy who presented with bowel obstruction due to internal hernia. He was thus admitted for resuscitation and eventually operative intervention.   HOSPITAL COURSE: Mr. Jesse Hughes was admitted. He was resuscitated. He was initially tried with NG  decompression to resolve his obstruction but he became febrile and tachycardic, confused and developed leukocytosis. Therefore, he went to the operating room on 12/09 and the above findings were found. Following hospital course, he has return of bowel function. He is currently on regular diet but not eating much. He did receive 1 unit of blood throughout hospital course. At the time of discharge, he was with good bowel function and minimal pain. He does have an incision VAC over his midline incision, which was changed yesterday.   DISCHARGE INSTRUCTIONS: Mr. Jesse Hughes is being transferred to the [facility] medical system. He is to assume care there. As mentioned, he is not tolerating regular diet due to lack of appetite and he appears to have some component of alcohol withdrawal.  His ostomy is functioning. His wound is clean, dry, and intact with a wound VAC, incisional VAC.    ____________________________ Jesse Hughes. Jesse Vitullo, MD cal:dp D: 11/18/2013 15:16:17 ET T: 11/18/2013 15:47:11 ET JOB#: [id number]  cc: Jesse Hughes A. Jesse Florendo, MD, <Dictator> Jesse Parkins MD ELECTRONICALLY SIGNED 11/19/2013 10:33

## 2015-03-29 NOTE — Consult Note (Signed)
PATIENT NAME:  Jesse Hughes, Jesse Hughes MR#:  [id number] DATE OF BIRTH:  Feb 21, 1961  DATE OF CONSULTATION:  11/12/2013  REFERRING PHYSICIAN:  Jerrol Banana. Burt Knack, MD CONSULTING PHYSICIAN:  Ceasar Lund. Anselm Jungling, MD  REASON FOR CONSULTATION:  Medical management of medical issues.   HISTORY OF PRESENT ILLNESS: This is a 54 year old male with past history of seizure disorder, posttraumatic stress disorder and depression.  He had rectal cancer and is status post resection and colostomy bag, was admitted to [facility] on 10/23/2013 and transferred to [facility] on 10/26/2013 for further management. As per patient, he was discharged from there a week ago after readjustment in his seizure medication and was doing fine till yesterday, when he started feeling pain in his abdomen which was 10 out of 10, constant and so decided to come to the Emergency Room. He called EMS and brought over here. Pain in the abdomen is constant and still 10 out of 10, and says he has been feeling nauseous now and vomited once in the Emergency Room. On CAT scan of the abdomen in the ER, he was found having small bowel obstruction and some hernia at the colostomy site so he is admitted to surgical service for further management of these issues and medical consult is called in to manage him medically. On further questioning, he denies any further issues except that since he started having abdominal pain yesterday, he thought beer would help to relieve it and he drank some beer for that without any success in relieving his pain. Otherwise, he denies using any alcohol in the last 2 to 3 months.   REVIEW OF SYSTEMS: CONSTITUTIONAL: Negative for fever, fatigue, weakness but positive for abdominal pain. No weight loss or weight gain.  EYES: No blurring, double vision, pain or redness. No discharge.  EARS, NOSE, THROAT: No tinnitus, ear pain or hearing loss.  RESPIRATORY: No cough, wheezing, hemoptysis or shortness of breath.  CARDIOVASCULAR: No  orthopnea, edema, arrhythmia or palpitations.  GASTROINTESTINAL: The patient has some nausea and vomited once but no diarrhea. Has severe abdominal pain. No hematemesis or blood in the stool. He says that he had regular collection in his colostomy of his stool which was still there till yesterday.  GENITOURINARY: Denies any increased frequency of the urination or pain during urination.  SKIN: No acne, rashes or lesions.  MUSCULOSKELETAL: No pain or swelling in the joints.  NEUROLOGICAL: No numbness, weakness, tremors or vertigo. PSYCHIATRIC: No anxiety, insomnia, bipolar disorder.   PAST MEDICAL HISTORY: 1.  History of rectal cancer.  2.  Posttraumatic stress disorder.  3.  Depression.  4.  Chronic back pain.  5.  History of seizure.  6.  Gastroesophageal reflux disease. 7.  Alcohol dependence in the past.  8.  Thrombocytopenia, likely due to alcohol and liver damage.  9.  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  10.  Hypertension.   FAMILY HISTORY: No history of heart disease but his sister, who was younger than him, died of some kind of unknown cancer which was metastatic.   SOCIAL HISTORY: The patient quit drinking about 5 to 6 months ago but then drank only 1 time yesterday to relieve his abdominal pain. He denies any IV drug use or smoking.    HOME MEDICATIONS: As confirmed by pharmacy technician in the ER: 1.  Thiamine 100 mg oral once a day.  2.  Omeprazole 20 mg  oral once a day.  3.  Lamotrigine 25 mg oral at bedtime for 1 week, then 1 tablet 2 times a day, then slowly upgrading up to 4 tablets 2 times a day.  4.  Lactase 3000 units oral tablet 3 times a day.  5.  Lacosamide 150 mg oral 2 times a day.  5.  Ferrous glucose 324 mg oral tablet 3 times a day.  6.  Citalopram 40 mg oral, take 1/2 tablet once a day.  7.  Atenolol 50 mg oral  tablet, take 1/2 tablet once a day.   PHYSICAL EXAMINATION: VITAL SIGNS:  Temperature 98.5, pulse rate 110, blood pressure 156/88 and pulse ox 96% on room air.  GENERAL: The patient is fully alert and oriented to time, place and person. He does not appear in any acute distress, slightly discomfort because of abdominal pain.  HEENT: Head and neck atraumatic. Conjunctivae pink. Oral mucosa moist.  NECK: Supple. No JVD.  RESPIRATORY: Bilateral clear and equal air entry.  CARDIOVASCULAR: S1, S2 present, regular. No murmur.  ABDOMEN: There is a big central scar of surgery present and ileostomy tube present on the right side upper abdomen surrounding some swelling. Bowel sounds present. Organomegaly is not felt.  SKIN:  No rashes on the skin.  LEGS: No edema.  NEUROLOGICAL: Power 5 out of 5. Follows commands. Mild tremor on stretching the hands outside. No rigidity.  JOINTS: No swelling or tenderness.  PSYCHIATRIC: Does not appear in any acute psychiatric illness at this time.   IMPORTANT LABORATORY AND RADIOLOGICAL RESULTS: Glucose 101, BUN 2, creatinine 0.46, sodium 140, potassium 4.0, chloride 107, CO2 25, calcium 8.7. Ethanol level is 154. Total protein 7.8, albumin 2.9, bilirubin 0.3, SGOT 43 and SGPT 33. WBC 9.1, hemoglobin 9.3, platelet count 386, MCV 77. Urinalysis is grossly negative. CT scan of the abdomen with contrast is done which showed high-grade proximal small bowel obstruction with suspected associated internal hernia, no bowel perforation, trace ascites, misty mesentery may be reactive, right ileostomy with predominantly reduced peristomal hernia, fullness of pancreatic head without discrete mass, mild eccentric bladder wall thickening could reflect cystitis, consider coordination with urinalysis versus cystoscopy   ASSESSMENT AND PLAN: A 54 year old male with history of rectal cancer and multiple abdominal surgeries because of hernias, colostomy and ileostomies and seizure disorder  presented to Emergency Room with abdominal pain and vomiting and found having high-grade small bowel obstruction, admitted to surgical service. Medical consult for medical issues management.  1.  Small bowel obstruction. Dr. Burt Knack from surgery is managing currently conservatively by  keeping him n.p.o., nasogastric tube suction, IV fluids and pain management.  2.  Seizure disorder. As the patient will not be able to take any oral medication because of small bowel obstruction I will switch his lacosamide to IV and also give Keppra IV to control his seizures. Currently, he is stable  3.  Hypertension. He will not be able to take any oral medications so will give hydralazine IV push as-needed basis for systolic blood pressure more than 160.   4.  Alcoholism. The patient states that he is not a chronic drinker and did not drink anything in the last 3 to 4 months, but just drank yesterday. Alcohol level is high currently.  As a precaution, will put him on clinical institute withdrawal assessment of alcohol protocol to control any withdrawal symptoms if they appear.  5.  Thrombocytopenia. This appears to be chronic till last admission but, surprisingly, today platelet count is 386.  We will continue checking it.  6: Anemia. Most likely it is due to iron deficiency, stable hemoglobin currently. We will continue monitoring  7.  Gastrointestinal prophylaxis with pantoprazole injection.   TOTAL TIME SPENT ON THIS CONSULT: Is 45 minutes.   We will continue following for medical issues. Thanks for the opportunity to participate in this patient's care.   ____________________________ Ceasar Lund. Anselm Jungling, MD vgv:cs D: 11/12/2013 11:33:00 ET T: 11/12/2013 15:18:20 ET JOB#: [id number]  cc: Ceasar Lund. Anselm Jungling, MD, <Dictator> Vaughan Basta MD ELECTRONICALLY SIGNED 11/14/2013 9:24

## 2015-03-29 NOTE — H&P (Signed)
PATIENT NAME:  Jesse Hughes, Jesse Hughes MR#:  [id number] DATE OF BIRTH:  Jun 03, 1961  DATE OF ADMISSION:  11/12/2013  CHIEF COMPLAINT: Abdominal pain.   HISTORY OF PRESENT ILLNESS: This is a patient with two days of abdominal pain with some nausea, occasional emesis, but minimal. He passed some gas earlier but has not had a bowel movement in two days.   Of note, the patient was recently transferred from [facility] and then recently discharged from the [facility] in [place] for seizure disorder and alcohol abuse and alcohol detox. He states that he has not had much intake this week of alcohol due to his recent discharge, but of note his blood alcohol level today in the hospital is 0.15. He states that he drank a beer this morning because his abdomen was hurting him. He has had symptoms like this in the past, which resolved spontaneously.   PAST MEDICAL HISTORY: Rectal cancer, PTSD, depression, reflux disease, severe alcohol abuse.   PAST SURGICAL HISTORY: Low anterior resection with ileostomy, ileostomy relocation due to parastomal hernia, multiple wound VAC changes for infected abdominal wound.   MEDICATIONS: Multiple, see chart.   ALLERGIES: LITHIUM.   FAMILY HISTORY: Noncontributory.   SOCIAL HISTORY: The patient does not smoke. Drinks alcohol, sometimes to excess, and recent history of detox. He is unemployed.   REVIEW OF SYSTEMS: A 10 system review is performed and negative with the exception of that mentioned in the history of present illness.   PHYSICAL EXAMINATION: GENERAL: Comfortable -appearing male patient.  VITAL SIGNS: Temperature is 98.5, pulse 97, respirations 20, blood pressure 140/79, 98% room air sat. Pain scale of 10.  HEENT: No scleral icterus.  NECK: No palpable neck nodes.  CHEST: Clear to auscultation.  CARDIAC: Regular rate and rhythm.  ABDOMEN: Distended, tympanitic, diffusely tender, but only minimally. No rebound, no percussion tenderness, no guarding. There is a  right lower quadrant ostomy site which is functional and nontender.  EXTREMITIES: Without edema.  NEUROLOGIC: Grossly intact.  INTEGUMENT: No jaundice.   LABORATORY, DIAGNOSTIC AND RADIOLOGICAL DATA: CT scan is personally reviewed showing a small bowel obstruction. Not clear if this IS due to an internal hernia. Dilated stomach.   Blood alcohol level 0.154. White blood cell count 9, hemoglobin and hematocrit of 9 and 29, platelet count 386. Normal electrolytes.   ASSESSMENT AND PLAN: This is a patient with a small bowel obstruction. He has a complex medical and surgical history. I will ask [facility] to see the patient to aid in any detox situation and I will reinstitute CIWA protocol.   A nasogastric tube will be placed and hydration will be performed. I will repeat films later this afternoon and re-examine the patient. Hopefully this will resolve without the need for further surgery and this was discussed with the patient.   ____________________________ Jerrol Banana. Burt Knack, MD rec:aw D: 11/12/2013 11:05:24 ET T: 11/12/2013 11:18:31 ET JOB#: [id number]  cc: Jerrol Banana. Burt Knack, MD, <Dictator> Florene Glen MD ELECTRONICALLY SIGNED 11/12/2013 12:57
